# Patient Record
Sex: Male | Born: 1965
Health system: Southern US, Community
[De-identification: ages and names within clinical notes are randomized; demographics above are authoritative.]

## PROBLEM LIST (undated history)

## (undated) DIAGNOSIS — R011 Cardiac murmur, unspecified: Secondary | ICD-10-CM

## (undated) DIAGNOSIS — F32A Depression, unspecified: Secondary | ICD-10-CM

## (undated) DIAGNOSIS — Z8659 Personal history of other mental and behavioral disorders: Secondary | ICD-10-CM

## (undated) DIAGNOSIS — J302 Other seasonal allergic rhinitis: Secondary | ICD-10-CM

## (undated) DIAGNOSIS — Z8042 Family history of malignant neoplasm of prostate: Secondary | ICD-10-CM

## (undated) DIAGNOSIS — K219 Gastro-esophageal reflux disease without esophagitis: Secondary | ICD-10-CM

## (undated) DIAGNOSIS — R7301 Impaired fasting glucose: Secondary | ICD-10-CM

## (undated) DIAGNOSIS — I10 Essential (primary) hypertension: Secondary | ICD-10-CM

## (undated) DIAGNOSIS — F1010 Alcohol abuse, uncomplicated: Secondary | ICD-10-CM

## (undated) DIAGNOSIS — F1011 Alcohol abuse, in remission: Secondary | ICD-10-CM

## (undated) DIAGNOSIS — E785 Hyperlipidemia, unspecified: Secondary | ICD-10-CM

## (undated) DIAGNOSIS — Z86010 Personal history of colonic polyps: Secondary | ICD-10-CM

## (undated) DIAGNOSIS — F329 Major depressive disorder, single episode, unspecified: Secondary | ICD-10-CM

## (undated) HISTORY — DX: Major depressive disorder, single episode, unspecified: F32.9

## (undated) HISTORY — DX: Essential (primary) hypertension: I10

## (undated) HISTORY — DX: Cardiac murmur, unspecified: R01.1

## (undated) HISTORY — DX: Personal history of colonic polyps: Z86.010

## (undated) HISTORY — DX: Alcohol abuse, in remission: F10.11

## (undated) HISTORY — PX: OTHER SURGICAL HISTORY: SHX169

## (undated) HISTORY — DX: Depression, unspecified: F32.A

## (undated) HISTORY — DX: Hyperlipidemia, unspecified: E78.5

## (undated) HISTORY — DX: Family history of malignant neoplasm of prostate: Z80.42

## (undated) HISTORY — DX: Impaired fasting glucose: R73.01

## (undated) HISTORY — DX: Personal history of other mental and behavioral disorders: Z86.59

## (undated) HISTORY — DX: Alcohol abuse, uncomplicated: F10.10

## (undated) HISTORY — DX: Other seasonal allergic rhinitis: J30.2

## (undated) HISTORY — DX: Gastro-esophageal reflux disease without esophagitis: K21.9

---
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
## 1997-03-23 HISTORY — PX: WISDOM TOOTH EXTRACTION: SHX21

## 1998-11-09 ENCOUNTER — Emergency Department (HOSPITAL_COMMUNITY): Admission: EM | Admit: 1998-11-09 | Discharge: 1998-11-09 | Payer: Self-pay | Admitting: *Deleted

## 2003-03-24 HISTORY — PX: EYE MUSCLE SURGERY: SHX370

## 2004-02-18 ENCOUNTER — Ambulatory Visit: Payer: Self-pay | Admitting: Internal Medicine

## 2004-02-27 ENCOUNTER — Ambulatory Visit: Payer: Self-pay | Admitting: Internal Medicine

## 2004-04-10 ENCOUNTER — Ambulatory Visit: Payer: Self-pay | Admitting: Internal Medicine

## 2004-04-18 ENCOUNTER — Ambulatory Visit: Payer: Self-pay | Admitting: Internal Medicine

## 2006-05-11 ENCOUNTER — Emergency Department (HOSPITAL_COMMUNITY): Admission: EM | Admit: 2006-05-11 | Discharge: 2006-05-12 | Payer: Self-pay | Admitting: Emergency Medicine

## 2008-11-24 ENCOUNTER — Emergency Department (HOSPITAL_BASED_OUTPATIENT_CLINIC_OR_DEPARTMENT_OTHER): Admission: EM | Admit: 2008-11-24 | Discharge: 2008-11-24 | Payer: Self-pay | Admitting: Emergency Medicine

## 2008-11-27 ENCOUNTER — Ambulatory Visit: Payer: Self-pay | Admitting: Family Medicine

## 2008-11-27 DIAGNOSIS — I10 Essential (primary) hypertension: Secondary | ICD-10-CM | POA: Insufficient documentation

## 2008-11-27 DIAGNOSIS — E785 Hyperlipidemia, unspecified: Secondary | ICD-10-CM | POA: Insufficient documentation

## 2008-11-27 DIAGNOSIS — K219 Gastro-esophageal reflux disease without esophagitis: Secondary | ICD-10-CM

## 2008-11-27 DIAGNOSIS — F329 Major depressive disorder, single episode, unspecified: Secondary | ICD-10-CM

## 2008-11-27 DIAGNOSIS — R209 Unspecified disturbances of skin sensation: Secondary | ICD-10-CM | POA: Insufficient documentation

## 2010-06-26 ENCOUNTER — Encounter: Payer: Self-pay | Admitting: Family Medicine

## 2010-07-02 ENCOUNTER — Ambulatory Visit (INDEPENDENT_AMBULATORY_CARE_PROVIDER_SITE_OTHER): Payer: BC Managed Care – PPO | Admitting: Family Medicine

## 2010-07-02 ENCOUNTER — Encounter: Payer: Self-pay | Admitting: Family Medicine

## 2010-07-02 DIAGNOSIS — I10 Essential (primary) hypertension: Secondary | ICD-10-CM

## 2010-07-02 DIAGNOSIS — Z125 Encounter for screening for malignant neoplasm of prostate: Secondary | ICD-10-CM

## 2010-07-02 DIAGNOSIS — E785 Hyperlipidemia, unspecified: Secondary | ICD-10-CM

## 2010-07-02 DIAGNOSIS — Z0000 Encounter for general adult medical examination without abnormal findings: Secondary | ICD-10-CM

## 2010-07-02 DIAGNOSIS — F1010 Alcohol abuse, uncomplicated: Secondary | ICD-10-CM

## 2010-07-02 LAB — LIPID PANEL
Cholesterol: 194 mg/dL (ref 0–200)
HDL: 44.2 mg/dL (ref >39.00)
LDL Cholesterol: 122 mg/dL — ABNORMAL HIGH (ref 0–99)
Total CHOL/HDL Ratio: 4
Triglycerides: 139 mg/dL (ref 0.0–149.0)
VLDL: 27.8 mg/dL (ref 0.0–40.0)

## 2010-07-02 LAB — CBC WITH DIFFERENTIAL/PLATELET
Basophils Absolute: 0 10*3/uL (ref 0.0–0.1)
Basophils Relative: 0.5 % (ref 0.0–3.0)
Eosinophils Absolute: 0.1 10*3/uL (ref 0.0–0.7)
Eosinophils Relative: 2.4 % (ref 0.0–5.0)
HCT: 43.5 % (ref 39.0–52.0)
Hemoglobin: 15.3 g/dL (ref 13.0–17.0)
Lymphocytes Relative: 32.4 % (ref 12.0–46.0)
Lymphs Abs: 1.3 10*3/uL (ref 0.7–4.0)
MCHC: 35.2 g/dL (ref 30.0–36.0)
MCV: 95 fl (ref 78.0–100.0)
Monocytes Absolute: 0.3 10*3/uL (ref 0.1–1.0)
Monocytes Relative: 8.4 % (ref 3.0–12.0)
Neutro Abs: 2.3 10*3/uL (ref 1.4–7.7)
Neutrophils Relative %: 56.3 % (ref 43.0–77.0)
Platelets: 166 10*3/uL (ref 150.0–400.0)
RBC: 4.58 Mil/uL (ref 4.22–5.81)
RDW: 12.5 % (ref 11.5–14.6)
WBC: 4.2 10*3/uL — ABNORMAL LOW (ref 4.5–10.5)

## 2010-07-02 LAB — COMPREHENSIVE METABOLIC PANEL
ALT: 27 U/L (ref 0–53)
AST: 14 U/L (ref 0–37)
Albumin: 4.5 g/dL (ref 3.5–5.2)
Alkaline Phosphatase: 54 U/L (ref 39–117)
BUN: 15 mg/dL (ref 6–23)
CO2: 31 mEq/L (ref 19–32)
Calcium: 9.5 mg/dL (ref 8.4–10.5)
Chloride: 103 mEq/L (ref 96–112)
Creatinine, Ser: 1 mg/dL (ref 0.4–1.5)
GFR: 83.39 mL/min (ref >60.00)
Glucose, Bld: 100 mg/dL — ABNORMAL HIGH (ref 70–99)
Potassium: 4.6 mEq/L (ref 3.5–5.1)
Sodium: 141 mEq/L (ref 135–145)
Total Bilirubin: 1.1 mg/dL (ref 0.3–1.2)
Total Protein: 6.6 g/dL (ref 6.0–8.3)

## 2010-07-02 LAB — PSA: PSA: 0.6 ng/mL (ref 0.10–4.00)

## 2010-07-02 LAB — TSH: TSH: 2.41 u[IU]/mL (ref 0.35–5.50)

## 2010-07-02 NOTE — Assessment & Plan Note (Signed)
Reviewed the current plans for the patient's chronic diagnoses. Routine lab monitoring ordered/reviewed as appropriate.  Discussed prudent diet and regular exercise, as well as basic weight management options as appropriate.  Discussed available screening tests and procedures as appropriate for patient's age and gender.

## 2010-07-02 NOTE — Assessment & Plan Note (Signed)
DRE normal today.  Obtained blood for PSA screening today.

## 2010-07-02 NOTE — Assessment & Plan Note (Signed)
I recommended he monitor bp a few times a week at home, call or return if persistently >140/90. I gave him a copy of the DASH diet and reviewed it with him today. Encouraged him to cut WAY back on alcohol intake.  Continue daily exercise. No meds at this time.

## 2010-07-02 NOTE — Assessment & Plan Note (Signed)
Check FLP today. Continue lifestyle modifications.

## 2010-07-02 NOTE — Assessment & Plan Note (Signed)
Encouraged patient to continue cutting back or quit altogether.  He is well aware of the physical and social risks of alcohol abuse. I'm checking a cmet and cbc today.

## 2010-07-02 NOTE — Progress Notes (Signed)
Office Note 07/02/2010  CC:  Chief Complaint  Patient presents with  . Annual Exam    no problems    HPI:  Brett Swanson is a 45 y.o. White male who is established with [facility] but new to me, here to establish care and get annual CPE. Past problems include mild HTN and hyperlipidemia, both of which he chose to manage with lifestyle mod only.  He doesn't routinely check his bp, and it has been a few years since his last CPE and lidid screen.  He walks several times per week.  He is trying to eat a low sodium diet that focuses more on lean meat and increased veggies and fruits, 6 small meals per day. He admits to drinking alcohol too much, about 1 bottle of wine per day in recent past but is trying to slowly cut back on this.  His parents both had ETOH problems.  He admits to having a problem but says it hasn't come to the point of adversely affecting his life.     Past Medical History  Diagnosis Date  . Depression   . GERD (gastroesophageal reflux disease)   . Hyperlipidemia   . Hypertension   . Chicken pox     Past Surgical History  Procedure Date  . Tonsillectomy 1973  . Hernia 1972   . Eye muscle surgery 2005  . Adenoidectomy 1973    Family History  Problem Relation Age of Onset  . Alcohol abuse Mother   . Cancer Mother 85    Breast, throat, lung  . Alcohol abuse Father   . Heart disease Father     enlarged heart  . COPD Father   . Hypertension Father   . Cancer Father 20    prostate    History   Social History  . Marital Status: Married    Spouse Name: Claris Che    Number of Children: 1  . Years of Education: 4   Occupational History  . 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 Smoking status: Never Smoker   . Smokeless tobacco: Not on file  . Alcohol Use: Yes  . Drug Use:   . Sexually Active:    Other Topics Concern  . Not on file   Social History Narrative   Married, one daughter.  Best boy for [employer].  Exercise: fast walking.Nonsmoker.  Alcohol: bottle of wine per day--cutting back.  No drug abuse.    No outpatient prescriptions prior to visit.    No Known Allergies  ROS Review of Systems  Constitutional: Negative for fever, chills, appetite change and fatigue.  HENT: Negative for ear pain, congestion, sore throat, neck stiffness and dental problem.   Eyes: Negative for discharge, redness and visual disturbance.  Respiratory: Negative for cough, chest tightness, shortness of breath and wheezing.   Cardiovascular: Negative for chest pain, palpitations and leg swelling.  Gastrointestinal: Negative for nausea, vomiting, abdominal pain, diarrhea and blood in stool.  Genitourinary: Negative for dysuria, urgency, frequency, hematuria, flank pain and difficulty urinating.  Musculoskeletal: Negative for myalgias, back pain, joint swelling and arthralgias.  Skin: Negative for pallor and rash.  Neurological: Negative for dizziness, speech difficulty, weakness and headaches.  Hematological: Negative for adenopathy. Does not bruise/bleed easily.  Psychiatric/Behavioral: Negative for confusion and sleep disturbance. The patient is not nervous/anxious.     PE;  Blood pressure 134/88, pulse 49, height 6' 0.25" (1.835 m), weight 194 lb (87.998  kg), SpO2 98.00%. Gen: Alert, well appearing.  Patient is oriented to person, place, time, and situation. HEENT: Scalp without lesions or hair loss.  Ears: EACs clear, normal epithelium.  TMs with good light reflex and landmarks bilaterally.  Eyes: no injection, icteris, swelling, or exudate.  EOMI, PERRLA. Nose: no drainage or turbinate edema/swelling.  No injection or focal lesion.  Mouth: lips without lesion/swelling.  Oral mucosa pink and moist.  Dentition intact and without obvious caries or gingival swelling.  Oropharynx without erythema, exudate, or swelling.  Some tonsillar tissue is evident in left anterior tonsillar pillar area on left >  right. Neck: supple, ROM full.  Carotids 2+ bilat, without bruit.  No lymphadenopathy, thyromegaly, or mass. Chest: symmetric expansion, nonlabored respirations.  Clear and equal breath sounds in all lung fields.   CV: RRR, no m/r/g.  Peripheral pulses 2+ and symmetric. ABD: soft, NT, ND, BS normal.  No hepatospenomegaly or mass.  No bruits. EXT: no clubbing, cyanosis, or edema.  SKIN: no jaundice, pallor, or worrisome skin lesions. Neuro: CN 2-12 intact bilaterally, strength 5/5 in proximal and distal upper extremities and lower extremities bilaterally.  No sensory deficits.  No tremor.  No disdiadochokinesis.  No ataxia.  Upper extremity and lower extremity DTRs symmetric.  No pronator drift. Rectal: no hemorrhoids or masses, rectal tone normal, no tenderness or nodularity to the prostate gland.  Stool wipings were heme neg in office today.   Pertinent labs:  12 lead EKG: marked sinus bradycardia, biphasic T waves in V1 and V2.  TWI in aVL.  Voltages, axis, and intervals all normal.  ASSESSMENT AND PLAN:   HYPERTENSION I recommended he monitor bp a few times a week at home, call or return if persistently >140/90. I gave him a copy of the DASH diet and reviewed it with him today. Encouraged him to cut WAY back on alcohol intake.  Continue daily exercise. No meds at this time.  HYPERLIPIDEMIA Check FLP today. Continue lifestyle modifications.  Alcohol abuse Encouraged patient to continue cutting back or quit altogether.  He is well aware of the physical and social risks of alcohol abuse. I'm checking a cmet and cbc today.  Prostate cancer screening DRE normal today.  Obtained blood for PSA screening today.  Health maintenance examination Reviewed the current plans for the patient's chronic diagnoses. Routine lab monitoring ordered/reviewed as appropriate.  Discussed prudent diet and regular exercise, as well as basic weight management options as appropriate.  Discussed available  screening tests and procedures as appropriate for patient's age and gender.      Return in about 3 months (around 10/01/2010).

## 2010-09-29 ENCOUNTER — Encounter: Payer: Self-pay | Admitting: Family Medicine

## 2010-09-29 ENCOUNTER — Ambulatory Visit (INDEPENDENT_AMBULATORY_CARE_PROVIDER_SITE_OTHER): Payer: BC Managed Care – PPO | Admitting: Family Medicine

## 2010-09-29 VITALS — BP 129/88 | HR 64 | Temp 98.1°F | Ht 72.25 in | Wt 196.0 lb

## 2010-09-29 DIAGNOSIS — S8010XA Contusion of unspecified lower leg, initial encounter: Secondary | ICD-10-CM

## 2010-09-29 DIAGNOSIS — R011 Cardiac murmur, unspecified: Secondary | ICD-10-CM

## 2010-09-29 DIAGNOSIS — I10 Essential (primary) hypertension: Secondary | ICD-10-CM

## 2010-09-29 DIAGNOSIS — S8011XA Contusion of right lower leg, initial encounter: Secondary | ICD-10-CM | POA: Insufficient documentation

## 2010-09-29 NOTE — Assessment & Plan Note (Signed)
Asymptomatic.  EKG unremarkable in the recent past. Will order 2-D echocardiogram to further assess this.

## 2010-09-29 NOTE — Progress Notes (Signed)
OFFICE NOTE  09/29/2010  CC:  Chief Complaint  Patient presents with  . Follow-up    3 month follow up     HPI:   Patient is a 45 y.o. Caucasian male who is here for f/u HTN. Doing ok with DASH diet, exercise is not-so routine. 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  Small laceration is healing well, hurts in golf ball sized area round this on anterior tibial surface.  Mild pain, worse with wt bearing. Denies chest pain, SOB, DOE, palpitations, or cough.  No legs swelling.  Pertinent PMH:  HTN-diet managemt. Hyperlipidemia in the past--diet mgmt--last lipids 3 mo ago were at goal. ETOH abuse--doing okay and cutting back on this (went 1 week without alcohol last week and said he had no withdrawal sx's or even cravings.  MEDS;   No outpatient prescriptions prior to visit.    PE: Blood pressure 129/88, pulse 64, temperature 98.1 F (36.7 C), temperature source Oral, height 6' 0.25" (1.835 m), weight 196 lb (88.905 kg), SpO2 95.00%. Gen: Alert, well appearing.  Patient is oriented to person, place, time, and situation. Neck: supple, ROM full.  Carotids 2+ bilat, without bruit.  No lymphadenopathy, thyromegaly, or mass. Chest: symmetric expansion, nonlabored respirations.  Clear and equal breath sounds in all lung fields.   CV: RRR, no 2/6 syst murmur at base, best heard at LUSB.  Peripheral pulses 2+ and symmetric. EXT: no clubbing, cyanosis, or edema.  Right anterior tibial surface with 2-3 cm healing abrasion/laceration---scab present.  No erythema, minimal palpable swelling deep to the abrasion.  No deformity.  No fluctuance.  Mildly TTP.   IMPRESSION AND PLAN:  HYPERTENSION Problem stable.  Continue current diet appropriate for this condition.  We have reviewed our general long term plan for this problem and also reviewed symptoms and signs that should prompt the patient to call or return to the office. I encouraged him to monitor  his bp more frequently at home (at least monthly, if not weekly).   Heart murmur, systolic Asymptomatic.  EKG unremarkable in the recent past. Will order 2-D echocardiogram to further assess this.  Contusion of lower leg, right Improving. Continue elevation, relative rest, ice prn, NSAIDs prn.     FOLLOW UP:  Return in about 6 months (around 04/01/2011) for f/u HTN and heart murmur.

## 2010-09-29 NOTE — Assessment & Plan Note (Signed)
Improving. Continue elevation, relative rest, ice prn, NSAIDs prn.

## 2010-09-29 NOTE — Assessment & Plan Note (Signed)
Problem stable.  Continue current diet appropriate for this condition.  We have reviewed our general long term plan for this problem and also reviewed symptoms and signs that should prompt the patient to call or return to the office. I encouraged him to monitor his bp more frequently at home (at least monthly, if not weekly).

## 2010-10-02 ENCOUNTER — Other Ambulatory Visit (HOSPITAL_COMMUNITY): Payer: Self-pay | Admitting: Family Medicine

## 2010-10-02 DIAGNOSIS — R011 Cardiac murmur, unspecified: Secondary | ICD-10-CM

## 2010-10-03 ENCOUNTER — Ambulatory Visit (HOSPITAL_COMMUNITY): Payer: BC Managed Care – PPO | Attending: Internal Medicine | Admitting: Radiology

## 2010-10-03 DIAGNOSIS — E785 Hyperlipidemia, unspecified: Secondary | ICD-10-CM | POA: Insufficient documentation

## 2010-10-03 DIAGNOSIS — I079 Rheumatic tricuspid valve disease, unspecified: Secondary | ICD-10-CM | POA: Insufficient documentation

## 2010-10-03 DIAGNOSIS — R011 Cardiac murmur, unspecified: Secondary | ICD-10-CM | POA: Insufficient documentation

## 2010-10-03 DIAGNOSIS — I10 Essential (primary) hypertension: Secondary | ICD-10-CM | POA: Insufficient documentation

## 2010-10-03 HISTORY — PX: TRANSTHORACIC ECHOCARDIOGRAM: SHX275

## 2011-04-01 ENCOUNTER — Encounter: Payer: Self-pay | Admitting: Family Medicine

## 2011-04-01 ENCOUNTER — Ambulatory Visit (INDEPENDENT_AMBULATORY_CARE_PROVIDER_SITE_OTHER): Payer: BC Managed Care – PPO | Admitting: Family Medicine

## 2011-04-01 DIAGNOSIS — I10 Essential (primary) hypertension: Secondary | ICD-10-CM

## 2011-04-01 DIAGNOSIS — Z23 Encounter for immunization: Secondary | ICD-10-CM

## 2011-04-01 DIAGNOSIS — E785 Hyperlipidemia, unspecified: Secondary | ICD-10-CM

## 2011-04-01 DIAGNOSIS — F1010 Alcohol abuse, uncomplicated: Secondary | ICD-10-CM

## 2011-04-01 DIAGNOSIS — Z125 Encounter for screening for malignant neoplasm of prostate: Secondary | ICD-10-CM

## 2011-04-01 NOTE — Assessment & Plan Note (Signed)
Problem stable.  Continue current diet appropriate for this condition.  We have reviewed our general long term plan for this problem and also reviewed symptoms and signs that should prompt the patient to call or return to the office. We'll recheck lipids at his CPE in 4-6 mo.

## 2011-04-01 NOTE — Assessment & Plan Note (Signed)
Problem stable.  Continue current diet appropriate for this condition.  We have reviewed our general long term plan for this problem and also reviewed symptoms and signs that should prompt the patient to call or return to the office. Continue periodic home monitoring: call if persistently > 150/95. Encouraged him to keep up the exercise and lower alcohol intake.

## 2011-04-01 NOTE — Assessment & Plan Note (Signed)
Encouraged him to knock it down to max of 1 glass of wine per night. No social/legal problems arising from his wine drinking at this point, although these calories are not helping his bp and wt any.

## 2011-04-01 NOTE — Progress Notes (Signed)
OFFICE NOTE  04/01/2011  CC:  Chief Complaint  Patient presents with  . Follow-up     HPI:   Patient is a 46 y.o. Caucasian male who is here for 6 mo f/u HTN, hyperlipidemia, GERD, and hx of ETOH abuse. Feeling good, power walking x 2 miles 5 days a week.  Following DASH diet more and more. Rare home bp checks "when I feel stressed" are max 130s over 90. No HA, vision c/o, CP, palpitations, or Sob. Has gone back to drinking more, vague answers about how much given today but sounds like over the holiday season he reverted back to his "bottle of wine a day" amount.  Admits that this is not good, plans on cutting back again.  No probs with law or socially with this amount of intake.    Pertinent PMH:  Past Medical History  Diagnosis Date  . Depression   . GERD (gastroesophageal reflux disease)   . Hyperlipidemia   . Hypertension   . Chicken pox   . Alcohol abuse     1 bottle of wine per day; began cutting back 2012  . Heart murmur, systolic     2 D ECHO normal 09/2010   Past surgical, family, and social history reviewed and there are no changes since the patient's last office visit with me.  MEDS;  NONE No outpatient prescriptions prior to visit.    PE: Blood pressure 127/89, pulse 47, height 6' 0.25" (1.835 m), weight 199 lb (90.266 kg). Gen: Alert, well appearing.  Patient is oriented to person, place, time, and situation. ENT: Ears: EACs clear, normal epithelium.  TMs with good light reflex and landmarks bilaterally.  Eyes: no injection, icteris, swelling, or exudate.  EOMI, PERRLA. Nose: no drainage or turbinate edema/swelling.  No injection or focal lesion.  Mouth: lips without lesion/swelling.  Oral mucosa pink and moist.  Dentition intact and without obvious caries or gingival swelling.  Oropharynx without erythema, exudate, or swelling.  Neck - No masses or thyromegaly or limitation in range of motion CV: RRR, no m/r/g.   LUNGS: CTA bilat, nonlabored resps, good  aeration in all lung fields. EXT: no clubbing, cyanosis, or edema.   LABS: none today  IMPRESSION AND PLAN:  HYPERTENSION Problem stable.  Continue current diet appropriate for this condition.  We have reviewed our general long term plan for this problem and also reviewed symptoms and signs that should prompt the patient to call or return to the office. Continue periodic home monitoring: call if persistently > 150/95. Encouraged him to keep up the exercise and lower alcohol intake.  HYPERLIPIDEMIA Problem stable.  Continue current diet appropriate for this condition.  We have reviewed our general long term plan for this problem and also reviewed symptoms and signs that should prompt the patient to call or return to the office. We'll recheck lipids at his CPE in 4-6 mo.   Alcohol abuse Encouraged him to knock it down to max of 1 glass of wine per night. No social/legal problems arising from his wine drinking at this point, although these calories are not helping his bp and wt any.     FOLLOW UP:  No Follow-up on file. F/u 4-100mo for CPE and get fasting labs the week prior (orders entered as future today).

## 2011-08-24 ENCOUNTER — Other Ambulatory Visit (INDEPENDENT_AMBULATORY_CARE_PROVIDER_SITE_OTHER): Payer: BC Managed Care – PPO

## 2011-08-24 DIAGNOSIS — Z23 Encounter for immunization: Secondary | ICD-10-CM

## 2011-08-24 DIAGNOSIS — E785 Hyperlipidemia, unspecified: Secondary | ICD-10-CM

## 2011-08-24 DIAGNOSIS — Z125 Encounter for screening for malignant neoplasm of prostate: Secondary | ICD-10-CM

## 2011-08-24 DIAGNOSIS — F1010 Alcohol abuse, uncomplicated: Secondary | ICD-10-CM

## 2011-08-24 DIAGNOSIS — I10 Essential (primary) hypertension: Secondary | ICD-10-CM

## 2011-08-24 LAB — LIPID PANEL
Cholesterol: 221 mg/dL — ABNORMAL HIGH (ref 0–200)
HDL: 43.6 mg/dL (ref >39.00)
Total CHOL/HDL Ratio: 5
Triglycerides: 365 mg/dL — ABNORMAL HIGH (ref 0.0–149.0)
VLDL: 73 mg/dL — ABNORMAL HIGH (ref 0.0–40.0)

## 2011-08-24 LAB — CBC WITH DIFFERENTIAL/PLATELET
Basophils Absolute: 0 10*3/uL (ref 0.0–0.1)
Basophils Relative: 0.7 % (ref 0.0–3.0)
Eosinophils Absolute: 0.1 10*3/uL (ref 0.0–0.7)
Eosinophils Relative: 3.1 % (ref 0.0–5.0)
HCT: 42.8 % (ref 39.0–52.0)
Hemoglobin: 14.8 g/dL (ref 13.0–17.0)
Lymphocytes Relative: 32.3 % (ref 12.0–46.0)
Lymphs Abs: 1.3 10*3/uL (ref 0.7–4.0)
MCHC: 34.5 g/dL (ref 30.0–36.0)
MCV: 95 fl (ref 78.0–100.0)
Monocytes Absolute: 0.4 10*3/uL (ref 0.1–1.0)
Monocytes Relative: 10.4 % (ref 3.0–12.0)
Neutro Abs: 2.2 10*3/uL (ref 1.4–7.7)
Neutrophils Relative %: 53.5 % (ref 43.0–77.0)
Platelets: 150 10*3/uL (ref 150.0–400.0)
RBC: 4.51 Mil/uL (ref 4.22–5.81)
RDW: 13.2 % (ref 11.5–14.6)
WBC: 4.2 10*3/uL — ABNORMAL LOW (ref 4.5–10.5)

## 2011-08-24 LAB — TSH: TSH: 2.74 u[IU]/mL (ref 0.35–5.50)

## 2011-08-24 LAB — COMPREHENSIVE METABOLIC PANEL
ALT: 40 U/L (ref 0–53)
AST: 23 U/L (ref 0–37)
Albumin: 4.3 g/dL (ref 3.5–5.2)
Alkaline Phosphatase: 59 U/L (ref 39–117)
BUN: 17 mg/dL (ref 6–23)
CO2: 28 mEq/L (ref 19–32)
Calcium: 9.6 mg/dL (ref 8.4–10.5)
Chloride: 104 mEq/L (ref 96–112)
Creatinine, Ser: 0.9 mg/dL (ref 0.4–1.5)
GFR: 99.48 mL/min (ref >60.00)
Glucose, Bld: 109 mg/dL — ABNORMAL HIGH (ref 70–99)
Potassium: 4 mEq/L (ref 3.5–5.1)
Sodium: 140 mEq/L (ref 135–145)
Total Bilirubin: 1.1 mg/dL (ref 0.3–1.2)
Total Protein: 6.5 g/dL (ref 6.0–8.3)

## 2011-08-24 LAB — PSA: PSA: 0.97 ng/mL (ref 0.10–4.00)

## 2011-09-01 ENCOUNTER — Encounter: Payer: Self-pay | Admitting: Family Medicine

## 2011-09-01 ENCOUNTER — Telehealth: Payer: Self-pay

## 2011-09-01 ENCOUNTER — Ambulatory Visit (INDEPENDENT_AMBULATORY_CARE_PROVIDER_SITE_OTHER): Payer: BC Managed Care – PPO | Admitting: Family Medicine

## 2011-09-01 VITALS — BP 133/87 | HR 62 | Ht 72.25 in | Wt 202.0 lb

## 2011-09-01 DIAGNOSIS — L259 Unspecified contact dermatitis, unspecified cause: Secondary | ICD-10-CM

## 2011-09-01 DIAGNOSIS — Z0000 Encounter for general adult medical examination without abnormal findings: Secondary | ICD-10-CM

## 2011-09-01 LAB — POCT URINALYSIS DIPSTICK
Blood, UA: NEGATIVE
Glucose, UA: NEGATIVE
Ketones, UA: NEGATIVE
Spec Grav, UA: 1.025
Urobilinogen, UA: 0.2

## 2011-09-01 MED ORDER — FLUTICASONE PROPIONATE 0.05 % EX CREA: TOPICAL_CREAM | Freq: Two times a day (BID) | CUTANEOUS | Status: AC

## 2011-09-01 NOTE — Assessment & Plan Note (Signed)
Discussed use of nonsedating OTC antihistamine prn. Start trial of generic cutivate 0.05% cream bid prn.

## 2011-09-01 NOTE — Telephone Encounter (Signed)
Pt states that he used the Fluticasone cream around 11 this morning and the rash seems to have got worse and blisters are bigger? Pt wants to know if the medication is causing this? Please advise?

## 2011-09-01 NOTE — Progress Notes (Signed)
Office Note 09/01/2011  CC:  Chief Complaint  Patient presents with  . Annual Exam    no problems    HPI:  Brett Swanson is a 46 y.o. White male who is here for annual health maintenance exam/CPE. He had fasting labs done last week and these were normal except gluc a bit over 100 and trigs elevated/HDL low.  PSA had increased 0.35 ng/ml over the last 63mo, but PSA total still wnl for age (.25). We reviewed and discussed these in detail today. Still drinking 2 drinks per night most nights, trying to cut back. Admits to excessive red meat intake in last 6 mo, admits to not exercising daily (at his max he was just walking on treadmill some days).  Has acute c/o itchy rash on both forearms and under left side of chin since coming in contact with poison ivy 3 d/a.  Has been applying caladryl lotion and it helps a little.    Past Medical History  Diagnosis Date  . Depression   . GERD (gastroesophageal reflux disease)   . Hyperlipidemia   . Hypertension   . Alcohol abuse     1 bottle of wine per day; began cutting back 2012  . Heart murmur, systolic     2 D ECHO normal 09/2010    Past Surgical History  Procedure Date  . Tonsillectomy 1973  . Hernia 1972   . Eye muscle surgery 2005  . Adenoidectomy 1973    Family History  Problem Relation Age of Onset  . Alcohol abuse Mother   . Cancer Mother 29    Breast, throat, lung  . Alcohol abuse Father   . Heart disease Father     enlarged heart  . COPD Father   . Hypertension Father   . Cancer Father 50    prostate    History   Social History  . Marital Status: Married    Spouse Name: Claris Che    Number of Children: 1  . Years of Education: 4   Occupational History  . 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 Smoking status: Never Smoker   . Smokeless tobacco: Never Used  . Alcohol Use: Yes     2 drinks a night  . Drug Use: Not on file  . Sexually Active: Not on file   Other Topics  Concern  . Not on file   Social History Narrative   Married, one daughter.  Best boy for [employer].  Exercise: fast walking.Nonsmoker.  Alcohol: bottle of wine per day--cutting back.  No drug abuse.    MEDS: NONE  No Known Allergies  ROS Review of Systems  Constitutional: Negative for fever, chills, appetite change and fatigue.  HENT: Negative for ear pain, congestion, sore throat, neck stiffness and dental problem.   Eyes: Negative for discharge, redness and visual disturbance.  Respiratory: Negative for cough, chest tightness, shortness of breath and wheezing.   Cardiovascular: Negative for chest pain, palpitations and leg swelling.  Gastrointestinal: Negative for nausea, vomiting, abdominal pain, diarrhea and blood in stool.  Genitourinary: Negative for dysuria, urgency, frequency, hematuria, flank pain and difficulty urinating.  Musculoskeletal: Negative for myalgias, back pain, joint swelling and arthralgias.  Skin: Positive for rash. Negative for pallor.       See HPI  Neurological: Negative for dizziness, speech difficulty, weakness and headaches.  Hematological: Negative for adenopathy. Does not bruise/bleed easily.  Psychiatric/Behavioral: Negative for confusion and sleep  disturbance. The patient is not nervous/anxious.     PE; Blood pressure 133/87, pulse 62, height 6' 0.25" (1.835 m), weight 202 lb (91.627 kg). Gen: Alert, well appearing.  Patient is oriented to person, place, time, and situation. ENT: Ears: EACs clear, normal epithelium.  TMs with good light reflex and landmarks bilaterally.  Eyes: no injection, icteris, swelling, or exudate.  EOMI, PERRLA. Nose: no drainage or turbinate edema/swelling.  No injection or focal lesion.  Mouth: lips without lesion/swelling.  Oral mucosa pink and moist.  Dentition intact and without obvious caries or gingival swelling.  Oropharynx without erythema, exudate, or swelling.  Neck: supple/nontender.  No LAD, mass,  or TM.  Carotid pulses 2+ bilaterally, without bruits. CV: RRR, no m/r/g.   LUNGS: CTA bilat, nonlabored resps, good aeration in all lung fields. ABD: soft, NT, ND, BS normal.  No hepatospenomegaly or mass.  No bruits. EXT: no clubbing, cyanosis, or edema.  Genitals normal; both testes normal without tenderness, masses, hydroceles, varicoceles, erythema or swelling. Shaft normal, circumcised, meatus normal without discharge. No inguinal hernia noted. No inguinal lymphadenopathy. Rectal exam: negative without mass, lesions or tenderness.  Prostate size feels normal, without nodularity or tenderness. SKIN: confluent pinkish papular rash with hyperkeratotic features on both forearms and left side of neck. Joints: no swelling or erythema.  Pertinent labs:   CC UA today was normal.  Lab Results  Component Value Date   TSH 2.74 08/24/2011   Lab Results  Component Value Date   WBC 4.2* 08/24/2011   HGB 14.8 08/24/2011   HCT 42.8 08/24/2011   MCV 95.0 08/24/2011   PLT 150.0 08/24/2011   Lab Results  Component Value Date   CREATININE 0.9 08/24/2011   BUN 17 08/24/2011   NA 140 08/24/2011   K 4.0 08/24/2011   CL 104 08/24/2011   CO2 28 08/24/2011   Lab Results  Component Value Date   ALT 40 08/24/2011   AST 23 08/24/2011   ALKPHOS 59 08/24/2011   BILITOT 1.1 08/24/2011   Lab Results  Component Value Date   CHOL 221* 08/24/2011   Lab Results  Component Value Date   HDL 43.60 08/24/2011   Lab Results  Component Value Date   LDLCALC 122* 07/02/2010   Lab Results  Component Value Date   TRIG 365.0* 08/24/2011   Lab Results  Component Value Date   CHOLHDL 5 08/24/2011   Lab Results  Component Value Date   PSA 0.97 08/24/2011   PSA 0.60 07/02/2010       ASSESSMENT AND PLAN:   Health maintenance examination Reviewed age and gender appropriate health maintenance issues (prudent diet, regular exercise, health risks of tobacco and excessive alcohol, use of seatbelts, fire alarms in home, use of sunscreen).   Also reviewed age and gender appropriate health screening as well as vaccine recommendations. I discussed his recent PSA testing showing borderline accelerated value compared to 66mo ago.  DRE normal.  Plan to repeat PSA in 6 mo. Emphasized importance of dietary/exercise/lifestyle modification in decreasing his risk of future morbidity. Plan is to repeat FLP and CMET at f/u in 6 months.  Contact dermatitis Discussed use of nonsedating OTC antihistamine prn. Start trial of generic cutivate 0.05% cream bid prn.     FOLLOW UP:  Return in about 6 months (around 03/02/2012) for office f/u and fasting labs.

## 2011-09-01 NOTE — Telephone Encounter (Signed)
Pt informed

## 2011-09-01 NOTE — Assessment & Plan Note (Signed)
Reviewed age and gender appropriate health maintenance issues (prudent diet, regular exercise, health risks of tobacco and excessive alcohol, use of seatbelts, fire alarms in home, use of sunscreen).  Also reviewed age and gender appropriate health screening as well as vaccine recommendations. I discussed his recent PSA testing showing borderline accelerated value compared to 42mo ago.  DRE normal.  Plan to repeat PSA in 6 mo. Emphasized importance of dietary/exercise/lifestyle modification in decreasing his risk of future morbidity. Plan is to repeat FLP and CMET at f/u in 6 months.

## 2011-09-01 NOTE — Telephone Encounter (Signed)
Do not know for sure if the cream is making the rash worse because I did not see it but it is possible. So he can not use any further doses today and see how it is in the morning. May take some Zyrtec today and some Benadryl tonight to manage the itching and burning

## 2012-03-02 ENCOUNTER — Ambulatory Visit: Payer: BC Managed Care – PPO | Admitting: Family Medicine

## 2015-04-16 ENCOUNTER — Encounter: Payer: Self-pay | Admitting: Family Medicine

## 2015-04-16 ENCOUNTER — Ambulatory Visit (INDEPENDENT_AMBULATORY_CARE_PROVIDER_SITE_OTHER): Payer: BLUE CROSS/BLUE SHIELD | Admitting: Family Medicine

## 2015-04-16 VITALS — BP 130/83 | HR 67 | Temp 99.9°F | Resp 20 | Wt 197.0 lb

## 2015-04-16 DIAGNOSIS — J0100 Acute maxillary sinusitis, unspecified: Secondary | ICD-10-CM | POA: Insufficient documentation

## 2015-04-16 MED ORDER — DOXYCYCLINE HYCLATE 100 MG PO TABS: 100.0000 mg | ORAL_TABLET | Freq: Two times a day (BID) | ORAL | Status: DC

## 2015-04-16 NOTE — Patient Instructions (Signed)

## 2015-04-16 NOTE — Progress Notes (Signed)
Patient ID: Brett Swanson, male   DOB: 10-Oct-1965, 51 y.o.   MRN: [id number]   Subjective:    Patient ID: Brett Swanson  DOB: Nov 28, 1965, 50 y.o.    MRN: [id number]  HPI  Sinus pressure: Patient presents for an acute office visit with a five-day history of nausea, rhinorrhea, sinus pressure, fever Tmax 101, chills, sneezing, dry cough and headache. Patient denies sore throat, vomit, diarrhea or rash. He has taken Alka-Seltzer cold medicine and aspirin for his symptoms, without relief. He has not had his flu shot this year, and has declined. He has a history of pneumonia greater than 10 years ago. He does not have any history of asthma.   All past medical history, allergies, surgical history, social history, family history was updated today since patient has not been seen in > 3  Years.   Past Medical History  Diagnosis Date  . Depression   . GERD (gastroesophageal reflux disease)   . Hyperlipidemia   . Hypertension   . Alcohol abuse     1 bottle of wine per day; began cutting back 2012  . Heart murmur, systolic     2 D ECHO normal 09/2010   No Known Allergies   Past Surgical History  Procedure Laterality Date  . Tonsillectomy  1973  . Hernia 1972    . Eye muscle surgery  2005  . Adenoidectomy  1973   Social History  Substance Use Topics  . Smoking status: Never Smoker   . Smokeless tobacco: Never Used  . Alcohol Use: Yes     Comment: 2 drinks a night    Review of Systems Negative, with the exception of above mentioned in HPI     Objective:   Physical Exam BP 130/83 mmHg  Pulse 67  Temp(Src) 99.9 F (37.7 C) (Oral)  Resp 20  Wt 197 lb (89.359 kg)  SpO2 96% Body mass index is 26.54 kg/(m^2). Gen: febrile. No acute distress. Nontoxic in appearance. Well developed, well nourished, pleasant, Caucasian male. HENT: AT. [place]. Bilateral TM visualized, full/no erythema or bulging. MMM, no oral lesions. Bilateral nares with erythema and swelling. Throat without  erythema, no exudates. Cough mild, Hoarseness no, TTP bilateral maxillary sinus. Eyes:Pupils Equal Round Reactive to light, Extraocular movements intact,  Conjunctiva without redness, discharge or icterus. Neck/lymp/: Supple, anterior cervical lymphadenopathy. Normal range of motion CV: RRR no murmurs, clicks, gallops, rubs. No edema. Chest: CTAB, no wheeze or crackles. Normal respiratory effort. Good air movement. Abd: Soft. Flat. NTND. BS present Skin: No rashes, purpura or petechiae. Neuro: Alert, Perla, EOMI, oriented 3.    Assessment & Plan:  Brett Swanson is a 50 y.o. present for acute OV after  > 3 years since OV with PCP.  1. Acute maxillary sinusitis, recurrence not specified - Encourage Flonase, Mucinex, rest hydration, humidifier use. - doxycycline (VIBRA-TABS) 100 MG tablet; Take 1 tablet (100 mg total) by mouth 2 (two) times daily.  Dispense: 20 tablet; Refill: 0 - Follow-up as needed  > 25 minutes spent with patient, >50% of time spent face to face counseling patient and coordinating care.

## 2015-06-21 ENCOUNTER — Telehealth: Payer: Self-pay

## 2015-06-21 NOTE — Telephone Encounter (Signed)
Patient declined flu vaccine this year.

## 2016-10-23 ENCOUNTER — Ambulatory Visit (INDEPENDENT_AMBULATORY_CARE_PROVIDER_SITE_OTHER): Payer: BLUE CROSS/BLUE SHIELD | Admitting: Family Medicine

## 2016-10-23 ENCOUNTER — Ambulatory Visit: Payer: BLUE CROSS/BLUE SHIELD | Admitting: Family Medicine

## 2016-10-23 ENCOUNTER — Encounter: Payer: Self-pay | Admitting: Family Medicine

## 2016-10-23 VITALS — BP 149/95 | HR 64 | Temp 98.4°F | Resp 16 | Wt 210.0 lb

## 2016-10-23 DIAGNOSIS — S3022XA Contusion of scrotum and testes, initial encounter: Secondary | ICD-10-CM

## 2016-10-23 DIAGNOSIS — M5441 Lumbago with sciatica, right side: Secondary | ICD-10-CM

## 2016-10-23 DIAGNOSIS — N50811 Right testicular pain: Secondary | ICD-10-CM

## 2016-10-23 NOTE — Progress Notes (Signed)
OFFICE VISIT  10/23/2016   CC:  Chief Complaint  Patient presents with  . Testicle Pain    x 1 week   HPI:    Patient is a 51 y.o. Caucasian male who presents for scrotal discomfort. Three weeks ago he woke up with R LB pain, radiated into gluteal area, and lateral aspect of R thigh.  Symptoms waxed and waned over the next week.  He then slept with a body pillow and was feeling less pain in mornings and it got worse later in the day.  Seems to continue gradually getting better.  Then, a few days ago he pinched his R testicle with his leg when he lifted the leg far medially to lift leg over a fence. Notes no swelling or redness of his testicle.  No numbness or tingling anywhere.  Says the pain intensity was mild. Still has a mild ache in R testicle.    Past Medical History:  Diagnosis Date  . Alcohol abuse    1 bottle of wine per day; began cutting back 2012  . Depression   . GERD (gastroesophageal reflux disease)   . Heart murmur, systolic    2 D ECHO normal 09/2010  . Hyperlipidemia   . Hypertension     Past Surgical History:  Procedure Laterality Date  . ADENOIDECTOMY  1973  . EYE MUSCLE SURGERY  2005  . hernia 1972    . TONSILLECTOMY  1973  . TRANSTHORACIC ECHOCARDIOGRAM  10/03/2010   Normal except grd I DD.    Outpatient Medications Prior to Visit  Medication Sig Dispense Refill  . doxycycline (VIBRA-TABS) 100 MG tablet Take 1 tablet (100 mg total) by mouth 2 (two) times daily. (Patient not taking: Reported on 10/23/2016) 20 tablet 0   No facility-administered medications prior to visit.     No Known Allergies  ROS As per HPI  PE: Blood pressure (!) 149/95, pulse 64, temperature 98.4 F (36.9 C), temperature source Oral, resp. rate 16, weight 210 lb (95.3 kg), SpO2 96 %. Gen: Alert, well appearing.  Patient is oriented to person, place, time, and situation. AFFECT: pleasant, lucid thought and speech. Minimal TTP in R LB.  No gluteal tenderness or tenderness  over ischial tuberosity.  LE strength 5/5 prox and dist bilat.  Neg sitting SLR bilat.   GU exam: Genitals normal; both testes without masses, hydroceles, varicoceles, erythema or swelling.  Mild focal tenderness to palpation on R testicle just anterior and lateral to the epididymus.   Penile shaft normal, circumcised, meatus normal without discharge. No inguinal hernia noted. No inguinal lymphadenopathy.   LABS:  none  IMPRESSION AND PLAN:  1) Right testicle contusion; no sign of mass, infection, hernia, or hydrocele. Reassured pt: watchful waiting approach. Suspect his testicle pain is not related in any way to his R LB pain with radiculopathy.  2) R LBP with right radiculopathy sx's: improving gradually. Stretch, rest, tylenol 1000 mg q6h prn.  An After Visit Summary was printed and given to the patient.  FOLLOW UP: Return if symptoms worsen or fail to improve.  Signed:  Crissie Sickles, MD           10/23/2016

## 2016-10-23 NOTE — Progress Notes (Deleted)
OFFICE VISIT  10/23/2016   CC: No chief complaint on file.    HPI:    Patient is a 51 y.o.  male who presents for scrotal discomfort.  Past Medical History:  Diagnosis Date  . Alcohol abuse    1 bottle of wine per day; began cutting back 2012  . Depression   . GERD (gastroesophageal reflux disease)   . Heart murmur, systolic    2 D ECHO normal 09/2010  . Hyperlipidemia   . Hypertension     Past Surgical History:  Procedure Laterality Date  . ADENOIDECTOMY  1973  . EYE MUSCLE SURGERY  2005  . hernia 1972    . TONSILLECTOMY  1973    Outpatient Medications Prior to Visit  Medication Sig Dispense Refill  . doxycycline (VIBRA-TABS) 100 MG tablet Take 1 tablet (100 mg total) by mouth 2 (two) times daily. 20 tablet 0   No facility-administered medications prior to visit.     No Known Allergies  ROS As per HPI  PE: There were no vitals taken for this visit. ***  LABS:    Chemistry      Component Value Date/Time   NA 140 08/24/2011 0824   K 4.0 08/24/2011 0824   CL 104 08/24/2011 0824   CO2 28 08/24/2011 0824   BUN 17 08/24/2011 0824   CREATININE 0.9 08/24/2011 0824      Component Value Date/Time   CALCIUM 9.6 08/24/2011 0824   ALKPHOS 59 08/24/2011 0824   AST 23 08/24/2011 0824   ALT 40 08/24/2011 0824   BILITOT 1.1 08/24/2011 0824     Lab Results  Component Value Date   WBC 4.2 (L) 08/24/2011   HGB 14.8 08/24/2011   HCT 42.8 08/24/2011   MCV 95.0 08/24/2011   PLT 150.0 08/24/2011     IMPRESSION AND PLAN:  No problem-specific Assessment & Plan notes found for this encounter.   FOLLOW UP: No Follow-up on file.

## 2016-11-07 ENCOUNTER — Emergency Department (HOSPITAL_BASED_OUTPATIENT_CLINIC_OR_DEPARTMENT_OTHER): Payer: BLUE CROSS/BLUE SHIELD

## 2016-11-07 ENCOUNTER — Emergency Department (HOSPITAL_BASED_OUTPATIENT_CLINIC_OR_DEPARTMENT_OTHER)
Admission: EM | Admit: 2016-11-07 | Discharge: 2016-11-07 | Disposition: A | Discharge status: Home / Self Care | Payer: BLUE CROSS/BLUE SHIELD | Attending: Emergency Medicine | Admitting: Emergency Medicine

## 2016-11-07 ENCOUNTER — Encounter (HOSPITAL_BASED_OUTPATIENT_CLINIC_OR_DEPARTMENT_OTHER): Payer: Self-pay | Admitting: Emergency Medicine

## 2016-11-07 DIAGNOSIS — I10 Essential (primary) hypertension: Secondary | ICD-10-CM | POA: Diagnosis not present

## 2016-11-07 DIAGNOSIS — M79672 Pain in left foot: Secondary | ICD-10-CM | POA: Insufficient documentation

## 2016-11-07 MED ORDER — TRAMADOL HCL 50 MG PO TABS: 50.0000 mg | ORAL_TABLET | Freq: Four times a day (QID) | ORAL | 0 refills | Status: DC | PRN

## 2016-11-07 NOTE — ED Notes (Signed)
Alert, NAD, calm, interactive, resps e/u, speaking in clear complete sentences, no dyspnea noted, skin W&D, VSS, "feel about the same", (denies: sob, nausea, dizziness or visual changes). Family at [facility].

## 2016-11-07 NOTE — ED Provider Notes (Signed)
Emergency Department Provider Note   I have reviewed the triage vital signs and the nursing notes.   HISTORY  Chief Complaint Foot Pain   HPI Brett Swanson is a 51 y.o. male presents to the ED with left foot pain for the last 24 hours after trying to jump over a ditch yesterday. He was wearing sandals at the time. Having pain at the base of the left great toe. No numbness or tingling. No reported laceration or abrasions. No falls with head trauma or LOC. Patient ambulatory but walking on the outside of the left foot. No fever or chills. No knee pain. No radiation of symptoms. Pain worse with ambulation or movement.   Past Medical History:  Diagnosis Date  . Alcohol abuse    1 bottle of wine per day; began cutting back 2012  . Depression   . GERD (gastroesophageal reflux disease)   . Heart murmur, systolic    2 D ECHO normal 09/2010  . Hyperlipidemia   . Hypertension     Patient Active Problem List   Diagnosis Date Noted  . Maxillary sinusitis, acute 04/16/2015  . Health maintenance examination 09/01/2011  . Contact dermatitis 09/01/2011  . Heart murmur, systolic 35/46/5681  . Alcohol abuse 07/02/2010  . HYPERLIPIDEMIA 11/27/2008  . DEPRESSION 11/27/2008  . HYPERTENSION 11/27/2008  . GERD 11/27/2008    Past Surgical History:  Procedure Laterality Date  . ADENOIDECTOMY  1973  . EYE MUSCLE SURGERY  2005  . hernia 1972    . TONSILLECTOMY  1973  . TRANSTHORACIC ECHOCARDIOGRAM  10/03/2010   Normal except grd I DD.    Current Outpatient Rx  . Order #: [id number] Class: Normal  . Order #: [id number] Class: Print    Allergies Patient has no known allergies.  Family History  Problem Relation Age of Onset  . Alcohol abuse Mother   . Cancer Mother 18       Breast, throat, lung  . Alcohol abuse Father   . Heart disease Father        enlarged heart  . COPD Father   . Hypertension Father   . Cancer Father 46       prostate    Social History Social History    Substance Use Topics  . Smoking status: Never Smoker  . Smokeless tobacco: Never Used  . Alcohol use Yes     Comment: 2 drinks a night    Review of Systems  Constitutional: No fever/chills Eyes: No visual changes. ENT: No sore throat. Cardiovascular: Denies chest pain. Respiratory: Denies shortness of breath. Gastrointestinal: No abdominal pain.  No nausea, no vomiting.  No diarrhea.  No constipation. Genitourinary: Negative for dysuria. Musculoskeletal: Negative for back pain. Positive left foot pain.  Skin: Negative for rash. Neurological: Negative for headaches, focal weakness or numbness.  10-point ROS otherwise negative.  ____________________________________________   PHYSICAL EXAM:  VITAL SIGNS: ED Triage Vitals [11/07/16 2030]  Enc Vitals Group     BP (!) 157/107     Pulse Rate 70     Resp 18     Temp 98.7 F (37.1 C)     Temp Source Oral     SpO2 96 %     Weight 210 lb (95.3 kg)     Height 6' (1.829 m)     Pain Score 6   Constitutional: Alert and oriented. Well appearing and in no acute distress. Eyes: Conjunctivae are normal.  Head: Atraumatic. Nose: No congestion/rhinnorhea. Mouth/Throat: Mucous membranes are  moist.  Neck: No stridor.  Cardiovascular: Normal rate, regular rhythm. Good peripheral circulation. Grossly normal heart sounds.   Respiratory: Normal respiratory effort.  No retractions. Lungs CTAB. Musculoskeletal: No lower extremity edema. No gross deformities of extremities. Mild tenderness over the left MTP. No redness or warmth.  Neurologic:  Normal speech and language. No gross focal neurologic deficits are appreciated.  Skin:  Skin is warm, dry and intact. No rash noted.  ____________________________________________  RADIOLOGY  Dg Foot Complete Left  Result Date: 11/07/2016 CLINICAL DATA:  Pain after stepping on rock EXAM: LEFT FOOT - COMPLETE 3+ VIEW COMPARISON:  None. FINDINGS: Frontal, oblique, and lateral views were obtained.  There is no fracture or dislocation. There is slight narrowing at the first MTP joint. No erosive change. IMPRESSION: Slight osteoarthritic change in the first MTP joint. No fracture or dislocation. Electronically Signed   By: Lowella Grip III M.D.   On: 11/07/2016 20:49    ____________________________________________   PROCEDURES  Procedure(s) performed:   Procedures  None ____________________________________________   INITIAL IMPRESSION / ASSESSMENT AND PLAN / ED COURSE  Pertinent labs & imaging results that were available during my care of the patient were reviewed by me and considered in my medical decision making (see chart for details).  Patient with mild left toe pain after injury yesterday. No midfoot discomfort. Ambulatory with some discomfort. No fracture or dislocation on plain film. Offered crutches but patient has some at home. Referred to Podiatry and advised NSAIDs for pain. Provided Tramadol for severe pain.   At this time, I do not feel there is any life-threatening condition present. I have reviewed and discussed all results (EKG, imaging, lab, urine as appropriate), exam findings with patient. I have reviewed nursing notes and appropriate previous records.  I feel the patient is safe to be discharged home without further emergent workup. Discussed usual and customary return precautions. Patient and family (if present) verbalize understanding and are comfortable with this plan.  Patient will follow-up with their primary care provider. If they do not have a primary care provider, information for follow-up has been provided to them. All questions have been answered.  ____________________________________________  FINAL CLINICAL IMPRESSION(S) / ED DIAGNOSES  Final diagnoses:  Foot pain, left     MEDICATIONS GIVEN DURING THIS VISIT:  Medications - No data to display   NEW OUTPATIENT MEDICATIONS STARTED DURING THIS VISIT:  Discharge Medication List as of  11/07/2016  9:56 PM    START taking these medications   Details  traMADol (ULTRAM) 50 MG tablet Take 1 tablet (50 mg total) by mouth every 6 (six) hours as needed., Starting Sat 11/07/2016, Print        Note:  This document was prepared using Dragon voice recognition software and may include unintentional dictation errors.  Nanda Quinton, MD Emergency Medicine    Long, [facility], MD 11/08/16 [phone]

## 2016-11-07 NOTE — Discharge Instructions (Signed)

## 2016-11-07 NOTE — ED Triage Notes (Signed)
Patient states that he was jumping over a ditch yesterday and landed wrong on his right foot. Patient continues to have pain to his right foot

## 2018-04-14 ENCOUNTER — Encounter: Payer: Self-pay | Admitting: Family Medicine

## 2018-04-14 ENCOUNTER — Encounter: Payer: Self-pay | Admitting: Gastroenterology

## 2018-04-14 ENCOUNTER — Ambulatory Visit (INDEPENDENT_AMBULATORY_CARE_PROVIDER_SITE_OTHER): Payer: BLUE CROSS/BLUE SHIELD | Admitting: Family Medicine

## 2018-04-14 VITALS — BP 132/82 | HR 54 | Temp 97.9°F | Resp 16 | Ht 72.5 in | Wt 202.0 lb

## 2018-04-14 DIAGNOSIS — Z125 Encounter for screening for malignant neoplasm of prostate: Secondary | ICD-10-CM

## 2018-04-14 DIAGNOSIS — E7800 Pure hypercholesterolemia, unspecified: Secondary | ICD-10-CM | POA: Diagnosis not present

## 2018-04-14 DIAGNOSIS — Z23 Encounter for immunization: Secondary | ICD-10-CM

## 2018-04-14 DIAGNOSIS — I10 Essential (primary) hypertension: Secondary | ICD-10-CM

## 2018-04-14 DIAGNOSIS — Z0000 Encounter for general adult medical examination without abnormal findings: Secondary | ICD-10-CM

## 2018-04-14 DIAGNOSIS — Z1211 Encounter for screening for malignant neoplasm of colon: Secondary | ICD-10-CM

## 2018-04-14 DIAGNOSIS — E663 Overweight: Secondary | ICD-10-CM

## 2018-04-14 LAB — CBC WITH DIFFERENTIAL/PLATELET
Basophils Absolute: 0 10*3/uL (ref 0.0–0.1)
Basophils Relative: 0.8 % (ref 0.0–3.0)
EOS ABS: 0.1 10*3/uL (ref 0.0–0.7)
EOS PCT: 1.6 % (ref 0.0–5.0)
HCT: 46.7 % (ref 39.0–52.0)
HEMOGLOBIN: 16 g/dL (ref 13.0–17.0)
LYMPHS ABS: 1 10*3/uL (ref 0.7–4.0)
Lymphocytes Relative: 27.6 % (ref 12.0–46.0)
MCHC: 34.3 g/dL (ref 30.0–36.0)
MCV: 93.3 fl (ref 78.0–100.0)
MONO ABS: 0.3 10*3/uL (ref 0.1–1.0)
Monocytes Relative: 7.9 % (ref 3.0–12.0)
NEUTROS ABS: 2.3 10*3/uL (ref 1.4–7.7)
NEUTROS PCT: 62.1 % (ref 43.0–77.0)
Platelets: 192 10*3/uL (ref 150.0–400.0)
RBC: 5 Mil/uL (ref 4.22–5.81)
RDW: 13.2 % (ref 11.5–15.5)
WBC: 3.8 10*3/uL — ABNORMAL LOW (ref 4.0–10.5)

## 2018-04-14 LAB — COMPREHENSIVE METABOLIC PANEL
ALBUMIN: 4.8 g/dL (ref 3.5–5.2)
ALT: 51 U/L (ref 0–53)
AST: 17 U/L (ref 0–37)
Alkaline Phosphatase: 49 U/L (ref 39–117)
BUN: 16 mg/dL (ref 6–23)
CO2: 31 mEq/L (ref 19–32)
Calcium: 9.9 mg/dL (ref 8.4–10.5)
Chloride: 102 mEq/L (ref 96–112)
Creatinine, Ser: 0.96 mg/dL (ref 0.40–1.50)
GFR: 82.33 mL/min (ref >60.00)
Glucose, Bld: 107 mg/dL — ABNORMAL HIGH (ref 70–99)
Potassium: 4.4 mEq/L (ref 3.5–5.1)
SODIUM: 139 meq/L (ref 135–145)
Total Bilirubin: 1.1 mg/dL (ref 0.2–1.2)
Total Protein: 6.9 g/dL (ref 6.0–8.3)

## 2018-04-14 LAB — LIPID PANEL
CHOLESTEROL: 201 mg/dL — AB (ref 0–200)
HDL: 41 mg/dL (ref >39.00)
LDL Cholesterol: 133 mg/dL — ABNORMAL HIGH (ref 0–99)
NONHDL: 159.78
Total CHOL/HDL Ratio: 5
Triglycerides: 135 mg/dL (ref 0.0–149.0)
VLDL: 27 mg/dL (ref 0.0–40.0)

## 2018-04-14 LAB — PSA: PSA: 1.07 ng/mL (ref 0.10–4.00)

## 2018-04-14 LAB — TSH: TSH: 1.18 u[IU]/mL (ref 0.35–4.50)

## 2018-04-14 NOTE — Addendum Note (Signed)
Addended by: Caroll Rancher L on: 04/14/2018 10:20 AM   Modules accepted: Orders, SmartSet

## 2018-04-14 NOTE — Progress Notes (Signed)
Office Note 04/14/2018  CC:  Chief Complaint  Patient presents with  . Annual Exam    Pt is fasting.     HPI:  Brett Swanson is a 53 y.o. White male who is here for annual health maintenance exam.  Occ bp check in the recent past outside of the office around 130s/80s.  Diet: trying to make a few sensible changes. Exercise: lots of walking, 8000 steps/day avg.  He has completely quit drinking as of 7 mo ago !!    Past Medical History:  Diagnosis Date  . Alcohol abuse    1 bottle of wine per day; began cutting back 2012  . Depression   . Family history of prostate cancer in father   . GERD (gastroesophageal reflux disease)   . Heart murmur, systolic    2 D ECHO normal 09/2010  . Hyperlipidemia   . Hypertension     Past Surgical History:  Procedure Laterality Date  . ADENOIDECTOMY  1973  . EYE MUSCLE SURGERY  2005  . hernia 1972    . TONSILLECTOMY  1973  . TRANSTHORACIC ECHOCARDIOGRAM  10/03/2010   Normal except grd I DD.    Family History  Problem Relation Age of Onset  . Alcohol abuse Mother   . Cancer Mother 14       Breast, throat, lung  . Alcohol abuse Father   . Heart disease Father        enlarged heart  . COPD Father   . Hypertension Father   . Cancer Father 69       prostate    Social History   Socioeconomic History  . Marital status: Married    Spouse name: Joycelyn Schmid  . Number of children: 1  . Years of education: 4  . Highest education level: Not on file  Occupational History  . Occupation: Scientist, clinical (histocompatibility and immunogenetics): [employer]    Comment: Sr. Psychologist, sport and exercise  Social Needs  . Financial resource strain: Not on file  . Food insecurity:    Worry: Not on file    Inability: Not on file  . Transportation needs:    Medical: Not on file    Non-medical: Not on file  Tobacco Use  . Smoking status: Light Tobacco Smoker    Years: 4.00    Types: Cigarettes  . Smokeless tobacco: Never Used  . Tobacco comment: <1 pk per week  Substance and Sexual  Activity  . Alcohol use: Not Currently    Comment: Quit 10/16/17  . Drug use: No  . Sexual activity: Yes  Lifestyle  . Physical activity:    Days per week: Not on file    Minutes per session: Not on file  . Stress: Not on file  Relationships  . Social connections:    Talks on phone: Not on file    Gets together: Not on file    Attends religious service: Not on file    Active member of club or organization: Not on file    Attends meetings of clubs or organizations: Not on file    Relationship status: Not on file  . Intimate partner violence:    Fear of current or ex partner: Not on file    Emotionally abused: Not on file    Physically abused: Not on file    Forced sexual activity: Not on file  Other Topics Concern  . Not on file  Social History Narrative   Married, one daughter.  Psychologist, sport and exercise for  Surveyor, mining.  Exercise: fast walking.   Nonsmoker.  Alcohol: bottle of wine per day--cutting back.  No drug abuse.    MEDS: none currently  No Known Allergies  ROS Review of Systems  Constitutional: Negative for appetite change, chills, fatigue and fever.  HENT: Negative for congestion, dental problem, ear pain and sore throat.   Eyes: Negative for discharge, redness and visual disturbance.  Respiratory: Negative for cough, chest tightness, shortness of breath and wheezing.   Cardiovascular: Negative for chest pain, palpitations and leg swelling.  Gastrointestinal: Negative for abdominal pain, blood in stool, diarrhea, nausea and vomiting.  Genitourinary: Negative for difficulty urinating, dysuria, flank pain, frequency, hematuria and urgency.  Musculoskeletal: Negative for arthralgias, back pain, joint swelling, myalgias and neck stiffness.  Skin: Negative for pallor and rash.  Neurological: Negative for dizziness, speech difficulty, weakness and headaches.  Hematological: Negative for adenopathy. Does not bruise/bleed easily.  Psychiatric/Behavioral: Negative for  confusion and sleep disturbance. The patient is not nervous/anxious.     PE; Blood pressure 132/82, pulse (!) 54, temperature 97.9 F (36.6 C), temperature source Oral, resp. rate 16, height 6' 0.5" (1.842 m), weight 202 lb (91.6 kg), SpO2 100 %. Body mass index is 27.02 kg/m.  Gen: Alert, well appearing.  Patient is oriented to person, place, time, and situation. AFFECT: pleasant, lucid thought and speech. ENT: Ears: EACs clear, normal epithelium.  TMs with good light reflex and landmarks bilaterally.  Eyes: no injection, icteris, swelling, or exudate.  EOMI, PERRLA. Nose: no drainage or turbinate edema/swelling.  No injection or focal lesion.  Mouth: lips without lesion/swelling.  Oral mucosa pink and moist.  Dentition intact and without obvious caries or gingival swelling.  Oropharynx without erythema, exudate, or swelling.  Neck: supple/nontender.  No LAD, mass, or TM.  Carotid pulses 2+ bilaterally, without bruits. CV: RRR, no m/r/g.   LUNGS: CTA bilat, nonlabored resps, good aeration in all lung fields. ABD: soft, NT, ND, BS normal.  No hepatospenomegaly or mass.  No bruits. EXT: no clubbing, cyanosis, or edema.  Musculoskeletal: no joint swelling, erythema, warmth, or tenderness.  ROM of all joints intact. Skin - no sores or suspicious lesions or rashes or color changes Rectal exam: negative without mass, lesions or tenderness, PROSTATE EXAM: smooth and symmetric without nodules or tenderness.   Pertinent labs:  Lab Results  Component Value Date   TSH 2.74 08/24/2011   Lab Results  Component Value Date   WBC 4.2 (L) 08/24/2011   HGB 14.8 08/24/2011   HCT 42.8 08/24/2011   MCV 95.0 08/24/2011   PLT 150.0 08/24/2011   Lab Results  Component Value Date   CREATININE 0.9 08/24/2011   BUN 17 08/24/2011   NA 140 08/24/2011   K 4.0 08/24/2011   CL 104 08/24/2011   CO2 28 08/24/2011   Lab Results  Component Value Date   ALT 40 08/24/2011   AST 23 08/24/2011   ALKPHOS 59  08/24/2011   BILITOT 1.1 08/24/2011   Lab Results  Component Value Date   CHOL 221 (H) 08/24/2011   Lab Results  Component Value Date   HDL 43.60 08/24/2011   Lab Results  Component Value Date   LDLCALC 122 (H) 07/02/2010   Lab Results  Component Value Date   TRIG 365.0 (H) 08/24/2011   Lab Results  Component Value Date   CHOLHDL 5 08/24/2011   Lab Results  Component Value Date   PSA 0.97 08/24/2011   PSA 0.60 07/02/2010  ASSESSMENT AND PLAN:   Health maintenance exam: Reviewed age and gender appropriate health maintenance issues (prudent diet, regular exercise, health risks of tobacco and excessive alcohol, use of seatbelts, fire alarms in home, use of sunscreen).  Also reviewed age and gender appropriate health screening as well as vaccine recommendations. Vaccines: Tdap due-->given today.   Flu vaccine-->given today. Labs: fasting HP + PSA. Prostate ca screening: DRE normal today , PSA. Colon ca screening: due for initial colon cancer screening-->discussed options today and pt chose to be referred for colonoscopy today.  An After Visit Summary was printed and given to the patient.  FOLLOW UP:  Return in about 1 year (around 04/15/2019) for annual CPE (fasting).  Signed:  Crissie Sickles, MD

## 2018-04-14 NOTE — Patient Instructions (Signed)

## 2018-04-15 ENCOUNTER — Other Ambulatory Visit (INDEPENDENT_AMBULATORY_CARE_PROVIDER_SITE_OTHER): Payer: BLUE CROSS/BLUE SHIELD

## 2018-04-15 DIAGNOSIS — R7301 Impaired fasting glucose: Secondary | ICD-10-CM

## 2018-04-15 LAB — HEMOGLOBIN A1C: Hgb A1c MFr Bld: 5.4 % (ref 4.6–6.5)

## 2018-04-20 ENCOUNTER — Ambulatory Visit (AMBULATORY_SURGERY_CENTER): Payer: Self-pay

## 2018-04-20 ENCOUNTER — Other Ambulatory Visit: Payer: Self-pay

## 2018-04-20 VITALS — Ht 72.0 in | Wt 201.2 lb

## 2018-04-20 DIAGNOSIS — Z1211 Encounter for screening for malignant neoplasm of colon: Secondary | ICD-10-CM

## 2018-04-20 MED ORDER — NA SULFATE-K SULFATE-MG SULF 17.5-3.13-1.6 GM/177ML PO SOLN: 1.0000 | Freq: Once | ORAL | 0 refills | Status: AC

## 2018-04-20 NOTE — Progress Notes (Signed)
No egg or soy allergy known to patient  No issues with past sedation with any surgeries  or procedures, no intubation problems  No diet pills per patient No home 02 use per patient  No blood thinners per patient  Pt denies issues with constipation  No A fib or A flutter  EMMI video sent to pt's e mail , pt declined    

## 2018-04-21 ENCOUNTER — Encounter: Payer: Self-pay | Admitting: Gastroenterology

## 2018-04-23 DIAGNOSIS — Z860101 Personal history of adenomatous and serrated colon polyps: Secondary | ICD-10-CM

## 2018-04-23 DIAGNOSIS — Z86010 Personal history of colonic polyps: Secondary | ICD-10-CM

## 2018-04-23 HISTORY — DX: Personal history of colonic polyps: Z86.010

## 2018-04-23 HISTORY — DX: Personal history of adenomatous and serrated colon polyps: Z86.0101

## 2018-05-02 ENCOUNTER — Ambulatory Visit (AMBULATORY_SURGERY_CENTER): Payer: BLUE CROSS/BLUE SHIELD | Admitting: Gastroenterology

## 2018-05-02 ENCOUNTER — Encounter: Payer: Self-pay | Admitting: Gastroenterology

## 2018-05-02 VITALS — BP 128/80 | HR 42 | Temp 97.5°F | Resp 13 | Ht 72.0 in | Wt 201.0 lb

## 2018-05-02 DIAGNOSIS — D123 Benign neoplasm of transverse colon: Secondary | ICD-10-CM

## 2018-05-02 DIAGNOSIS — D125 Benign neoplasm of sigmoid colon: Secondary | ICD-10-CM | POA: Diagnosis not present

## 2018-05-02 DIAGNOSIS — Z1211 Encounter for screening for malignant neoplasm of colon: Secondary | ICD-10-CM | POA: Diagnosis present

## 2018-05-02 DIAGNOSIS — K635 Polyp of colon: Secondary | ICD-10-CM | POA: Diagnosis not present

## 2018-05-02 DIAGNOSIS — K640 First degree hemorrhoids: Secondary | ICD-10-CM

## 2018-05-02 HISTORY — PX: COLONOSCOPY W/ POLYPECTOMY: SHX1380

## 2018-05-02 MED ORDER — SODIUM CHLORIDE 0.9 % IV SOLN: 500.0000 mL | Freq: Once | INTRAVENOUS | Status: DC

## 2018-05-02 NOTE — Op Note (Signed)
[place] Patient Name: Brett Swanson Procedure Date: 05/02/2018 1:54 PM MRN: [id number] Endoscopist: Gerrit Heck , MD Age: 53 Referring MD:  Date of Birth: 1965/12/23 Gender: Male Account #: [id number] Procedure:                Colonoscopy Indications:              Screening for colorectal malignant neoplasm, This                            is the patient's first colonoscopy Medicines:                Monitored Anesthesia Care Procedure:                Pre-Anesthesia Assessment:                           - Prior to the procedure, a History and Physical                            was performed, and patient medications and                            allergies were reviewed. The patient's tolerance of                            previous anesthesia was also reviewed. The risks                            and benefits of the procedure and the sedation                            options and risks were discussed with the patient.                            All questions were answered, and informed consent                            was obtained. Prior Anticoagulants: The patient has                            taken no previous anticoagulant or antiplatelet                            agents. ASA Grade Assessment: II - A patient with                            mild systemic disease. After reviewing the risks                            and benefits, the patient was deemed in                            satisfactory condition to undergo the procedure.  After obtaining informed consent, the colonoscope                            was passed under direct vision. Throughout the                            procedure, the patient's blood pressure, pulse, and                            oxygen saturations were monitored continuously. The                            Colonoscope was introduced through the anus and                            advanced to the the  terminal ileum. The colonoscopy                            was performed without difficulty. The patient                            tolerated the procedure well. The quality of the                            bowel preparation was adequate. The terminal ileum,                            ileocecal valve, appendiceal orifice, and rectum                            were photographed. Scope In: 2:03:10 PM Scope Out: 2:27:50 PM Scope Withdrawal Time: 0 hours 19 minutes 55 seconds  Total Procedure Duration: 0 hours 24 minutes 40 seconds  Findings:                 The perianal and digital rectal examinations were                            normal.                           Two semi-sessile polyps were found in the                            transverse colon. The polyps were 4 to 6 mm in                            size. These polyps were removed with a cold snare.                            Resection and retrieval were complete. Estimated                            blood loss was minimal.  Four sessile polyps were found in the sigmoid                            colon. The polyps were 2 to 3 mm in size. These                            polyps were removed with a cold biopsy forceps.                            Resection and retrieval were complete. Estimated                            blood loss was minimal.                           Non-bleeding internal hemorrhoids were found during                            retroflexion. The hemorrhoids were small.                           Retroflexion in the right colon was performed.                           The terminal ileum appeared normal. Complications:            No immediate complications. Estimated Blood Loss:     Estimated blood loss was minimal. Impression:               - Two 4 to 6 mm polyps in the transverse colon,                            removed with a cold snare. Resected and retrieved.                           -  Four 2 to 3 mm polyps in the sigmoid colon,                            removed with a cold biopsy forceps. Resected and                            retrieved.                           - Non-bleeding internal hemorrhoids.                           - The examined portion of the ileum was normal. Recommendation:           - Patient has a contact number available for                            emergencies. The signs and symptoms of potential  delayed complications were discussed with the                            patient. Return to normal activities tomorrow.                            Written discharge instructions were provided to the                            patient.                           - Resume previous diet today.                           - Continue present medications.                           - Await pathology results.                           - Repeat colonoscopy in 3 - 5 years for                            surveillance based on pathology results.                           - Return to GI office PRN.                           - Use fiber, for example Citrucel, Fibercon, Konsyl                            or Metamucil. Gerrit Heck, MD 05/02/2018 2:34:30 PM

## 2018-05-02 NOTE — Progress Notes (Signed)
Called to room to assist during endoscopic procedure.  Patient ID and intended procedure confirmed with present staff. Received instructions for my participation in the procedure from the performing physician.  

## 2018-05-02 NOTE — Patient Instructions (Signed)
Handout given on polyps Recommendation for 1st meal: Eggs Grits  Toast Pancakes/waffles Lean meat  NO DRIVING TODAY   YOU HAD AN ENDOSCOPIC PROCEDURE TODAY AT [facility]:   Refer to the procedure report that was given to you for any specific questions about what was found during the examination.  If the procedure report does not answer your questions, please call your gastroenterologist to clarify.  If you requested that your care partner not be given the details of your procedure findings, then the procedure report has been included in a sealed envelope for you to review at your convenience later.  YOU SHOULD EXPECT: Some feelings of bloating in the abdomen. Passage of more gas than usual.  Walking can help get rid of the air that was put into your GI tract during the procedure and reduce the bloating. If you had a lower endoscopy (such as a colonoscopy or flexible sigmoidoscopy) you may notice spotting of blood in your stool or on the toilet paper. If you underwent a bowel prep for your procedure, you may not have a normal bowel movement for a few days.  Please Note:  You might notice some irritation and congestion in your nose or some drainage.  This is from the oxygen used during your procedure.  There is no need for concern and it should clear up in a day or so.  SYMPTOMS TO REPORT IMMEDIATELY:   Following lower endoscopy (colonoscopy or flexible sigmoidoscopy):  Excessive amounts of blood in the stool  Significant tenderness or worsening of abdominal pains  Swelling of the abdomen that is new, acute  Fever of 100F or higher   For urgent or emergent issues, a gastroenterologist can be reached at any hour by calling [phone].   DIET:  We do recommend a small meal at first, but then you may proceed to your regular diet.  Drink plenty of fluids but you should avoid alcoholic beverages for 24 hours.  ACTIVITY:  You should plan to take it easy for the rest of  today and you should NOT DRIVE or use heavy machinery until tomorrow (because of the sedation medicines used during the test).    FOLLOW UP: Our staff will call the number listed on your records the next business day following your procedure to check on you and address any questions or concerns that you may have regarding the information given to you following your procedure. If we do not reach you, we will leave a message.  However, if you are feeling well and you are not experiencing any problems, there is no need to return our call.  We will assume that you have returned to your regular daily activities without incident.  If any biopsies were taken you will be contacted by phone or by letter within the next 1-3 weeks.  Please call us at [phone] if you have not heard about the biopsies in 3 weeks.    SIGNATURES/CONFIDENTIALITY: You and/or your care partner have signed paperwork which will be entered into your electronic medical record.  These signatures attest to the fact that that the information above on your After Visit Summary has been reviewed and is understood.  Full responsibility of the confidentiality of this discharge information lies with you and/or your care-partner.

## 2018-05-02 NOTE — Progress Notes (Signed)
PT taken to PACU. Monitors in place. VSS. Report given to RN. 

## 2018-05-03 ENCOUNTER — Telehealth: Payer: Self-pay

## 2018-05-03 NOTE — Telephone Encounter (Signed)
  Follow up Call-  Call back number 05/02/2018  Post procedure Call Back phone  # [phone]  Permission to leave phone message Yes  Some recent data might be hidden     Patient questions:  Do you have a fever, pain , or abdominal swelling? No. Pain Score  0 *  Have you tolerated food without any problems? Yes  Have you been able to return to your normal activities? Yes.    Do you have any questions about your discharge instructions: Diet   No. Medications  No. Follow up visit  No.  Do you have questions or concerns about your Care? No.  Actions: * If pain score is 4 or above: No action needed, pain <4.

## 2018-05-09 ENCOUNTER — Encounter: Payer: Self-pay | Admitting: Family Medicine

## 2018-05-12 ENCOUNTER — Encounter: Payer: Self-pay | Admitting: Gastroenterology

## 2018-05-13 ENCOUNTER — Encounter: Payer: Self-pay | Admitting: Family Medicine

## 2018-09-16 ENCOUNTER — Encounter (HOSPITAL_BASED_OUTPATIENT_CLINIC_OR_DEPARTMENT_OTHER): Payer: Self-pay | Admitting: Emergency Medicine

## 2018-09-16 ENCOUNTER — Other Ambulatory Visit: Payer: Self-pay

## 2018-09-16 ENCOUNTER — Emergency Department (HOSPITAL_BASED_OUTPATIENT_CLINIC_OR_DEPARTMENT_OTHER)
Admission: EM | Admit: 2018-09-16 | Discharge: 2018-09-16 | Disposition: A | Discharge status: Home / Self Care | Payer: BC Managed Care – PPO | Attending: Emergency Medicine | Admitting: Emergency Medicine

## 2018-09-16 DIAGNOSIS — Y999 Unspecified external cause status: Secondary | ICD-10-CM | POA: Diagnosis not present

## 2018-09-16 DIAGNOSIS — Y93G1 Activity, food preparation and clean up: Secondary | ICD-10-CM | POA: Diagnosis not present

## 2018-09-16 DIAGNOSIS — F17210 Nicotine dependence, cigarettes, uncomplicated: Secondary | ICD-10-CM | POA: Diagnosis not present

## 2018-09-16 DIAGNOSIS — S61211A Laceration without foreign body of left index finger without damage to nail, initial encounter: Secondary | ICD-10-CM | POA: Insufficient documentation

## 2018-09-16 DIAGNOSIS — I10 Essential (primary) hypertension: Secondary | ICD-10-CM | POA: Insufficient documentation

## 2018-09-16 DIAGNOSIS — W260XXA Contact with knife, initial encounter: Secondary | ICD-10-CM | POA: Insufficient documentation

## 2018-09-16 DIAGNOSIS — Y92000 Kitchen of unspecified non-institutional (private) residence as  the place of occurrence of the external cause: Secondary | ICD-10-CM | POA: Diagnosis not present

## 2018-09-16 NOTE — ED Notes (Signed)
Patient verbalizes understanding of discharge instructions. Opportunity for questioning and answers were provided. Armband removed by staff, pt discharged from ED.  

## 2018-09-16 NOTE — ED Triage Notes (Signed)
Pt present with a laceration to palmer surface of the left index finger. Pt was cutting a dinner roll and serrated knife cut his finger. Bleeding controlled.

## 2018-09-16 NOTE — ED Provider Notes (Signed)
[facility] Provider Note   CSN: [id number] Arrival date & time: 09/16/18  1936  History   Chief Complaint Chief Complaint  Patient presents with  . Laceration    HPI Brett Swanson is a 53 y.o. male with PMH significant for GERD, depression, HTN, HLD here for lacteration of L index finger with serrated kitchen knife while preparing dinner about 2 hours ago. He immediately cleaned the area with water. He was subsequently able to control the bleeding with direct pressure. He denies injuries to any other parts of the body.     Past Medical History:  Diagnosis Date  . Alcohol abuse    1 bottle of wine per day; began cutting back 2012  . Depression   . Family history of prostate cancer in father   . GERD (gastroesophageal reflux disease)   . Heart murmur, systolic    2 D ECHO normal 09/2010  . History of adenomatous polyp of colon 04/2018   Recall 3-5 yrs  . Hyperlipidemia   . Hypertension     Patient Active Problem List   Diagnosis Date Noted  . Maxillary sinusitis, acute 04/16/2015  . Health maintenance examination 09/01/2011  . Contact dermatitis 09/01/2011  . Heart murmur, systolic 19/62/2297  . Alcohol abuse 07/02/2010  . HYPERLIPIDEMIA 11/27/2008  . DEPRESSION 11/27/2008  . HYPERTENSION 11/27/2008  . GERD 11/27/2008    Past Surgical History:  Procedure Laterality Date  . ADENOIDECTOMY  1973  . COLONOSCOPY W/ POLYPECTOMY  05/02/2018   Adenomatous polyps (multiple). Recall 3-5 yrs  . EYE MUSCLE SURGERY  2005  . hernia 1972    . TONSILLECTOMY  1973  . TRANSTHORACIC ECHOCARDIOGRAM  10/03/2010   Normal except grd I DD.     Home Medications    Prior to Admission medications   Not on File    Family History Family History  Problem Relation Age of Onset  . Alcohol abuse Mother   . Cancer Mother 1       Breast, throat, lung  . Esophageal cancer Mother   . Alcohol abuse Father   . Heart disease Father        enlarged  heart  . COPD Father   . Hypertension Father   . Cancer Father 36       prostate  . Rectal cancer Neg Hx   . Stomach cancer Neg Hx   . Colon cancer Neg Hx     Social History Social History   Tobacco Use  . Smoking status: Light Tobacco Smoker    Years: 4.00    Types: Cigarettes  . Smokeless tobacco: Never Used  . Tobacco comment: <1 pk per week  Substance Use Topics  . Alcohol use: Not Currently    Comment: Quit 10/16/17  . Drug use: No     Allergies   Patient has no known allergies.   Review of Systems Review of Systems  Constitutional: Negative for fever.  Respiratory: Negative for shortness of breath.   Cardiovascular: Negative for chest pain.  Hematological: Bruises/bleeds easily.   Physical Exam Updated Vital Signs BP (!) 180/87   Pulse 64   Temp 98.4 F (36.9 C) (Oral)   Resp 18   Ht 6' (1.829 m)   Wt 89.8 kg   SpO2 96%   BMI 26.85 kg/m   Physical Exam Constitutional:      General: He is not in acute distress.    Appearance: Normal appearance. He is not ill-appearing, toxic-appearing  or diaphoretic.  HENT:     Head: Normocephalic.  Cardiovascular:     Rate and Rhythm: Normal rate and regular rhythm.     Heart sounds: Normal heart sounds.  Pulmonary:     Effort: Pulmonary effort is normal. No respiratory distress.     Breath sounds: Normal breath sounds.  Musculoskeletal: Normal range of motion.     Comments: Full ROM of fingers of L hand. 2+ DP pulse. Intact grip strength.  Skin:    General: Skin is warm and dry.     Capillary Refill: Capillary refill takes less than 2 seconds.     Comments: ~7DZ sliced laceration of pad of L index finger, no active bleeding or purulence, no surrounding erythema or rash.   Neurological:     Mental Status: He is alert.    ED Treatments / Results  Labs (all labs ordered are listed, but only abnormal results are displayed) Labs Reviewed - No data to display  EKG None  Radiology No results found.   Procedures .Marland KitchenLaceration Repair  Date/Time: 09/16/2018 8:21 PM Performed by: Rory Percy, DO Authorized by: Gareth Morgan, MD   Consent:    Consent obtained:  Verbal   Consent given by:  Patient   Risks discussed:  Infection, pain and need for additional repair Anesthesia (see MAR for exact dosages):    Anesthesia method:  None Laceration details:    Location:  Finger   Finger location:  L index finger   Length (cm):  1 Pre-procedure details:    Preparation:  Patient was prepped and draped in usual sterile fashion Exploration:    Hemostasis achieved with:  Direct pressure   Wound exploration: wound explored through full range of motion     Wound extent: no foreign bodies/material noted, no muscle damage noted, no nerve damage noted, no tendon damage noted, no underlying fracture noted and no vascular damage noted     Contaminated: no   Treatment:    Area cleansed with:  Saline   Amount of cleaning:  Standard   Irrigation solution:  Sterile saline   Irrigation volume:  20cc   Irrigation method:  Syringe   Visualized foreign bodies/material removed: no   Skin repair:    Repair method:  Tissue adhesive Approximation:    Approximation:  Close Post-procedure details:    Dressing:  Open (no dressing)   Patient tolerance of procedure:  Tolerated well, no immediate complications   (including critical care time)  Medications Ordered in ED Medications - No data to display   Initial Impression / Assessment and Plan / ED Course  I have reviewed the triage vital signs and the nursing notes.  Pertinent labs & imaging results that were available during my care of the patient were reviewed by me and considered in my medical decision making (see chart for details).   53 yo M w/ h/o GERD, depression, HTN, HLD here with clean sliced laceration to L index finger a few hours ago. Area cleaned in usual fashion and repaired with Dermabond without any immediate complications. Stable  for discharge with return precautions reviewed. Advised to f/u with PCP for high blood pressure.     Final Clinical Impressions(s) / ED Diagnoses   Final diagnoses:  Laceration of left index finger without foreign body without damage to nail, initial encounter    ED Discharge Orders    None       Rory Percy, DO 09/16/18 2025    Gareth Morgan, MD 09/17/18 [phone]

## 2018-09-16 NOTE — Discharge Instructions (Addendum)
You were seen today for skin laceration, this was repaired with dermabond. Keep the area clean and dry for at least 24 hours, do not submerge in water. If you notice the area get swollen, red, or notice discharge from the wound, come back to be seen. See your primary doctor to follow up on your blood pressure.

## 2018-09-16 NOTE — ED Notes (Signed)
ED Provider at bedside. 

## 2019-04-17 ENCOUNTER — Ambulatory Visit (INDEPENDENT_AMBULATORY_CARE_PROVIDER_SITE_OTHER): Payer: BC Managed Care – PPO | Admitting: Family Medicine

## 2019-04-17 ENCOUNTER — Other Ambulatory Visit: Payer: Self-pay

## 2019-04-17 ENCOUNTER — Encounter: Payer: Self-pay | Admitting: Family Medicine

## 2019-04-17 ENCOUNTER — Encounter: Payer: BLUE CROSS/BLUE SHIELD | Admitting: Family Medicine

## 2019-04-17 VITALS — BP 154/100 | HR 56 | Temp 97.7°F | Resp 16 | Ht 72.5 in | Wt 210.4 lb

## 2019-04-17 DIAGNOSIS — R7301 Impaired fasting glucose: Secondary | ICD-10-CM

## 2019-04-17 DIAGNOSIS — Z0000 Encounter for general adult medical examination without abnormal findings: Secondary | ICD-10-CM | POA: Diagnosis not present

## 2019-04-17 DIAGNOSIS — Z125 Encounter for screening for malignant neoplasm of prostate: Secondary | ICD-10-CM

## 2019-04-17 DIAGNOSIS — Z23 Encounter for immunization: Secondary | ICD-10-CM | POA: Diagnosis not present

## 2019-04-17 DIAGNOSIS — E7800 Pure hypercholesterolemia, unspecified: Secondary | ICD-10-CM

## 2019-04-17 DIAGNOSIS — I10 Essential (primary) hypertension: Secondary | ICD-10-CM

## 2019-04-17 LAB — CBC WITH DIFFERENTIAL/PLATELET
Basophils Absolute: 0 10*3/uL (ref 0.0–0.1)
Basophils Relative: 0.7 % (ref 0.0–3.0)
Eosinophils Absolute: 0.1 10*3/uL (ref 0.0–0.7)
Eosinophils Relative: 2 % (ref 0.0–5.0)
HCT: 44.5 % (ref 39.0–52.0)
Hemoglobin: 15.5 g/dL (ref 13.0–17.0)
Lymphocytes Relative: 32.4 % (ref 12.0–46.0)
Lymphs Abs: 1.4 10*3/uL (ref 0.7–4.0)
MCHC: 34.9 g/dL (ref 30.0–36.0)
MCV: 92.7 fl (ref 78.0–100.0)
Monocytes Absolute: 0.4 10*3/uL (ref 0.1–1.0)
Monocytes Relative: 8.8 % (ref 3.0–12.0)
Neutro Abs: 2.4 10*3/uL (ref 1.4–7.7)
Neutrophils Relative %: 56.1 % (ref 43.0–77.0)
Platelets: 189 10*3/uL (ref 150.0–400.0)
RBC: 4.8 Mil/uL (ref 4.22–5.81)
RDW: 12.8 % (ref 11.5–15.5)
WBC: 4.3 10*3/uL (ref 4.0–10.5)

## 2019-04-17 LAB — TSH: TSH: 2.1 u[IU]/mL (ref 0.35–4.50)

## 2019-04-17 LAB — COMPREHENSIVE METABOLIC PANEL
ALT: 37 U/L (ref 0–53)
AST: 16 U/L (ref 0–37)
Albumin: 4.6 g/dL (ref 3.5–5.2)
Alkaline Phosphatase: 56 U/L (ref 39–117)
BUN: 16 mg/dL (ref 6–23)
CO2: 30 mEq/L (ref 19–32)
Calcium: 9.4 mg/dL (ref 8.4–10.5)
Chloride: 102 mEq/L (ref 96–112)
Creatinine, Ser: 0.96 mg/dL (ref 0.40–1.50)
GFR: 82 mL/min (ref >60.00)
Glucose, Bld: 117 mg/dL — ABNORMAL HIGH (ref 70–99)
Potassium: 4.3 mEq/L (ref 3.5–5.1)
Sodium: 137 mEq/L (ref 135–145)
Total Bilirubin: 0.9 mg/dL (ref 0.2–1.2)
Total Protein: 6.7 g/dL (ref 6.0–8.3)

## 2019-04-17 LAB — LIPID PANEL
Cholesterol: 218 mg/dL — ABNORMAL HIGH (ref 0–200)
HDL: 43.5 mg/dL (ref >39.00)
LDL Cholesterol: 140 mg/dL — ABNORMAL HIGH (ref 0–99)
NonHDL: 174.47
Total CHOL/HDL Ratio: 5
Triglycerides: 172 mg/dL — ABNORMAL HIGH (ref 0.0–149.0)
VLDL: 34.4 mg/dL (ref 0.0–40.0)

## 2019-04-17 LAB — HEMOGLOBIN A1C: Hgb A1c MFr Bld: 5.4 % (ref 4.6–6.5)

## 2019-04-17 LAB — PSA: PSA: 1.79 ng/mL (ref 0.10–4.00)

## 2019-04-17 MED ORDER — LISINOPRIL 10 MG PO TABS: 10.0000 mg | ORAL_TABLET | Freq: Every day | ORAL | 0 refills | Status: DC

## 2019-04-17 NOTE — Patient Instructions (Signed)

## 2019-04-17 NOTE — Addendum Note (Signed)
Addended by: Deveron Furlong D on: 04/17/2019 09:10 AM   Modules accepted: Orders

## 2019-04-17 NOTE — Progress Notes (Signed)
Office Note 04/17/2019  CC:  Chief Complaint  Patient presents with  . Annual Exam    pt is fasting    HPI:  Brett Swanson is a 54 y.o. White male who is here for annual health maintenance exam.  HTN: not doing any home monitoring BP up here today. No exercise. Feeling lazy working at home. Diet: "not bad".  Some snacking more since working at home.  Wt 209 stable per pt the last year per home checks. Alc: nothing in excess at all.  Past Medical History:  Diagnosis Date  . Alcohol abuse    1 bottle of wine per day; began cutting back 2012  . Depression   . Family history of prostate cancer in father   . GERD (gastroesophageal reflux disease)   . Heart murmur, systolic    2 D ECHO normal 09/2010  . History of adenomatous polyp of colon 04/2018   Recall 3-5 yrs  . Hyperlipidemia   . Hypertension     Past Surgical History:  Procedure Laterality Date  . ADENOIDECTOMY  1973  . COLONOSCOPY W/ POLYPECTOMY  05/02/2018   Adenomatous polyps (multiple). Recall 3-5 yrs  . EYE MUSCLE SURGERY  2005  . hernia 1972    . TONSILLECTOMY  1973  . TRANSTHORACIC ECHOCARDIOGRAM  10/03/2010   Normal except grd I DD.    Family History  Problem Relation Age of Onset  . Alcohol abuse Mother   . Cancer Mother 64       Breast, throat, lung  . Esophageal cancer Mother   . Alcohol abuse Father   . Heart disease Father        enlarged heart  . COPD Father   . Hypertension Father   . Cancer Father 57       prostate  . Rectal cancer Neg Hx   . Stomach cancer Neg Hx   . Colon cancer Neg Hx     Social History   Socioeconomic History  . Marital status: Married    Spouse name: Joycelyn Schmid  . Number of children: 1  . Years of education: 4  . Highest education level: Not on file  Occupational History  . Occupation: Scientist, clinical (histocompatibility and immunogenetics): [employer]    Comment: Sr. Psychologist, sport and exercise  Tobacco Use  . Smoking status: Light Tobacco Smoker    Years: 4.00    Types: Cigarettes  . Smokeless  tobacco: Never Used  . Tobacco comment: <1 pk per week  Substance and Sexual Activity  . Alcohol use: Not Currently    Comment: Quit 10/16/17  . Drug use: No  . Sexual activity: Yes  Other Topics Concern  . Not on file  Social History Narrative   Married, one daughter.  Psychologist, sport and exercise for [employer].  Exercise: fast walking.   Nonsmoker.  Alcohol: bottle of wine per day--cutting back.  No drug abuse.   Social Determinants of Health   Financial Resource Strain:   . Difficulty of Paying Living Expenses: Not on file  Food Insecurity:   . Worried About Charity fundraiser in the Last Year: Not on file  . Ran Out of Food in the Last Year: Not on file  Transportation Needs:   . Lack of Transportation (Medical): Not on file  . Lack of Transportation (Non-Medical): Not on file  Physical Activity:   . Days of Exercise per Week: Not on file  . Minutes of Exercise per Session: Not on file  Stress:   .  Feeling of Stress : Not on file  Social Connections:   . Frequency of Communication with Friends and Family: Not on file  . Frequency of Social Gatherings with Friends and Family: Not on file  . Attends Religious Services: Not on file  . Active Member of Clubs or Organizations: Not on file  . Attends Archivist Meetings: Not on file  . Marital Status: Not on file  Intimate Partner Violence:   . Fear of Current or Ex-Partner: Not on file  . Emotionally Abused: Not on file  . Physically Abused: Not on file  . Sexually Abused: Not on file    Outpatient Medications Prior to Visit  Medication Sig Dispense Refill  . Calcium Carbonate Antacid (TUMS PO) Take by mouth as needed.     No facility-administered medications prior to visit.    No Known Allergies  ROS Review of Systems  Constitutional: Negative for appetite change, chills, fatigue and fever.  HENT: Negative for congestion, dental problem, ear pain and sore throat.   Eyes: Negative for discharge, redness and  visual disturbance.  Respiratory: Negative for cough, chest tightness, shortness of breath and wheezing.   Cardiovascular: Negative for chest pain, palpitations and leg swelling.  Gastrointestinal: Negative for abdominal pain, blood in stool, diarrhea, nausea and vomiting.  Genitourinary: Negative for difficulty urinating, dysuria, flank pain, frequency, hematuria and urgency.  Musculoskeletal: Negative for arthralgias, back pain, joint swelling, myalgias and neck stiffness.  Skin: Negative for pallor and rash.  Neurological: Negative for dizziness, speech difficulty, weakness and headaches.  Hematological: Negative for adenopathy. Does not bruise/bleed easily.  Psychiatric/Behavioral: Negative for confusion and sleep disturbance. The patient is not nervous/anxious.     PE; Repeat manual bp at end of visit was 130/78 Blood pressure (!) 154/100, pulse (!) 56, temperature 97.7 F (36.5 C), temperature source Temporal, resp. rate 16, height 6' 0.5" (1.842 m), weight 210 lb 6.4 oz (95.4 kg), SpO2 95 %. Body mass index is 28.14 kg/m.  Gen: Alert, well appearing.  Patient is oriented to person, place, time, and situation. AFFECT: pleasant, lucid thought and speech. ENT: Ears: EACs clear, normal epithelium.  TMs with good light reflex and landmarks bilaterally.  Eyes: no injection, icteris, swelling, or exudate.  EOMI, PERRLA. Nose: no drainage or turbinate edema/swelling.  No injection or focal lesion.  Mouth: lips without lesion/swelling.  Oral mucosa pink and moist.  Dentition intact and without obvious caries or gingival swelling.  Oropharynx without erythema, exudate, or swelling.  Neck: supple/nontender.  No LAD, mass, or TM.  Carotid pulses 2+ bilaterally, without bruits. CV: RRR, no m/r/g.   LUNGS: CTA bilat, nonlabored resps, good aeration in all lung fields. ABD: soft, NT, ND, BS normal.  No hepatospenomegaly or mass.  No bruits. EXT: no clubbing, cyanosis, or edema.  Musculoskeletal: no  joint swelling, erythema, warmth, or tenderness.  ROM of all joints intact. Skin - no sores or suspicious lesions or rashes or color changes Rectal exam: negative without mass, lesions or tenderness, PROSTATE EXAM: smooth and symmetric without nodules or tenderness.   Pertinent labs:  Lab Results  Component Value Date   TSH 1.18 04/14/2018   Lab Results  Component Value Date   WBC 3.8 (L) 04/14/2018   HGB 16.0 04/14/2018   HCT 46.7 04/14/2018   MCV 93.3 04/14/2018   PLT 192.0 04/14/2018   Lab Results  Component Value Date   CREATININE 0.96 04/14/2018   BUN 16 04/14/2018   NA 139 04/14/2018  K 4.4 04/14/2018   CL 102 04/14/2018   CO2 31 04/14/2018   Lab Results  Component Value Date   ALT 51 04/14/2018   AST 17 04/14/2018   ALKPHOS 49 04/14/2018   BILITOT 1.1 04/14/2018   Lab Results  Component Value Date   CHOL 201 (H) 04/14/2018   Lab Results  Component Value Date   HDL 41.00 04/14/2018   Lab Results  Component Value Date   LDLCALC 133 (H) 04/14/2018   Lab Results  Component Value Date   TRIG 135.0 04/14/2018   Lab Results  Component Value Date   CHOLHDL 5 04/14/2018   Lab Results  Component Value Date   PSA 1.07 04/14/2018   PSA 0.97 08/24/2011   PSA 0.60 07/02/2010   Lab Results  Component Value Date   HGBA1C 5.4 04/15/2018   ASSESSMENT AND PLAN:   1) HTN: needs to start antihypertensive. Will start lisinopril 10mg  qd.  Repeat bmet 1 week. F/u HTN o/v 2 wks.  Home bp monitoring recommended.  2) Health maintenance exam: Reviewed age and gender appropriate health maintenance issues (prudent diet, regular exercise, health risks of tobacco and excessive alcohol, use of seatbelts, fire alarms in home, use of sunscreen).  Also reviewed age and gender appropriate health screening as well as vaccine recommendations. Vaccines: Tdap UTD.  Flu->given today.   Shingrix->#1 given today. Labs: fasting HP, Hba1c (IFG), PSA. Prostate ca screening: DRE  normal,PSA. Colon ca screening: polyps 04/2018->recall 3-5 yrs.  An After Visit Summary was printed and given to the patient.  FOLLOW UP:  No follow-ups on file.  Signed:  Crissie Sickles, MD           04/17/2019

## 2019-04-18 ENCOUNTER — Encounter: Payer: Self-pay | Admitting: Family Medicine

## 2019-04-18 ENCOUNTER — Other Ambulatory Visit: Payer: Self-pay

## 2019-04-18 DIAGNOSIS — E7800 Pure hypercholesterolemia, unspecified: Secondary | ICD-10-CM

## 2019-04-18 MED ORDER — ATORVASTATIN CALCIUM 20 MG PO TABS: 20.0000 mg | ORAL_TABLET | Freq: Every day | ORAL | 2 refills | Status: DC

## 2019-04-24 ENCOUNTER — Other Ambulatory Visit: Payer: Self-pay

## 2019-04-24 ENCOUNTER — Ambulatory Visit (INDEPENDENT_AMBULATORY_CARE_PROVIDER_SITE_OTHER): Payer: BC Managed Care – PPO | Admitting: Family Medicine

## 2019-04-24 DIAGNOSIS — I10 Essential (primary) hypertension: Secondary | ICD-10-CM

## 2019-04-24 DIAGNOSIS — E7800 Pure hypercholesterolemia, unspecified: Secondary | ICD-10-CM

## 2019-04-24 LAB — BASIC METABOLIC PANEL
BUN: 17 mg/dL (ref 6–23)
CO2: 26 mEq/L (ref 19–32)
Calcium: 9.4 mg/dL (ref 8.4–10.5)
Chloride: 104 mEq/L (ref 96–112)
Creatinine, Ser: 0.9 mg/dL (ref 0.40–1.50)
GFR: 88.34 mL/min (ref >60.00)
Glucose, Bld: 112 mg/dL — ABNORMAL HIGH (ref 70–99)
Potassium: 4.4 mEq/L (ref 3.5–5.1)
Sodium: 138 mEq/L (ref 135–145)

## 2019-04-28 ENCOUNTER — Other Ambulatory Visit: Payer: Self-pay

## 2019-05-01 ENCOUNTER — Ambulatory Visit (INDEPENDENT_AMBULATORY_CARE_PROVIDER_SITE_OTHER): Payer: BC Managed Care – PPO | Admitting: Family Medicine

## 2019-05-01 ENCOUNTER — Encounter: Payer: Self-pay | Admitting: Family Medicine

## 2019-05-01 ENCOUNTER — Other Ambulatory Visit: Payer: Self-pay

## 2019-05-01 VITALS — BP 130/78 | HR 52 | Temp 97.7°F | Resp 16 | Ht 72.5 in | Wt 211.2 lb

## 2019-05-01 DIAGNOSIS — E7800 Pure hypercholesterolemia, unspecified: Secondary | ICD-10-CM

## 2019-05-01 DIAGNOSIS — I10 Essential (primary) hypertension: Secondary | ICD-10-CM | POA: Diagnosis not present

## 2019-05-01 MED ORDER — LISINOPRIL 20 MG PO TABS: 20.0000 mg | ORAL_TABLET | Freq: Every day | ORAL | 0 refills | Status: DC

## 2019-05-01 NOTE — Progress Notes (Signed)
OFFICE VISIT  05/01/2019   CC:  Chief Complaint  Patient presents with  . Follow-up    hypertension, pt is fasting   HPI:    Patient is a 54 y.o. Caucasian male who presents for 2 wk f/u HTN. A/P as of last visit: "HTN: needs to start antihypertensive. Will start lisinopril 10mg  qd.  Repeat bmet 1 week. F/u HTN o/v 2 wks.  Home bp monitoring recommended."  Interim hx: I also started pt on atorvastatin for HLD 2 wks ago. Tolerating meds fine. Has improved diet some.  Started exercising again. BPs 140-150s, 90s-100s.   Past Medical History:  Diagnosis Date  . Alcohol abuse    1 bottle of wine per day; began cutting back 2012  . Depression   . Family history of prostate cancer in father   . GERD (gastroesophageal reflux disease)   . Heart murmur, systolic    2 D ECHO normal 09/2010  . History of adenomatous polyp of colon 04/2018   Recall 3-5 yrs  . Hyperlipidemia    Statin recommended 03/2019  . Hypertension     Past Surgical History:  Procedure Laterality Date  . ADENOIDECTOMY  1973  . COLONOSCOPY W/ POLYPECTOMY  05/02/2018   Adenomatous polyps (multiple). Recall 3-5 yrs  . EYE MUSCLE SURGERY  2005  . hernia 1972    . TONSILLECTOMY  1973  . TRANSTHORACIC ECHOCARDIOGRAM  10/03/2010   Normal except grd I DD.    Outpatient Medications Prior to Visit  Medication Sig Dispense Refill  . atorvastatin (LIPITOR) 20 MG tablet Take 1 tablet (20 mg total) by mouth daily. 30 tablet 2  . Calcium Carbonate Antacid (TUMS PO) Take by mouth as needed.    Marland Kitchen lisinopril (ZESTRIL) 10 MG tablet Take 1 tablet (10 mg total) by mouth daily. 30 tablet 0   No facility-administered medications prior to visit.    No Known Allergies  ROS As per HPI  PE: Initial bp today 155/94.  Manual bp check later in visit was 130/78 Blood pressure 130/78, pulse (!) 52, temperature 97.7 F (36.5 C), temperature source Temporal, resp. rate 16, height 6' 0.5" (1.842 m), weight 211 lb 3.2 oz (95.8  kg), SpO2 99 %. Gen: Alert, well appearing.  Patient is oriented to person, place, time, and situation. AFFECT: pleasant, lucid thought and speech. CV: RRR, no m/r/g.   LUNGS: CTA bilat, nonlabored resps, good aeration in all lung fields.  LABS:  Lab Results  Component Value Date   TSH 2.10 04/17/2019   Lab Results  Component Value Date   WBC 4.3 04/17/2019   HGB 15.5 04/17/2019   HCT 44.5 04/17/2019   MCV 92.7 04/17/2019   PLT 189.0 04/17/2019   Lab Results  Component Value Date   CREATININE 0.90 04/24/2019   BUN 17 04/24/2019   NA 138 04/24/2019   K 4.4 04/24/2019   CL 104 04/24/2019   CO2 26 04/24/2019   Lab Results  Component Value Date   ALT 37 04/17/2019   AST 16 04/17/2019   ALKPHOS 56 04/17/2019   BILITOT 0.9 04/17/2019   Lab Results  Component Value Date   CHOL 218 (H) 04/17/2019   Lab Results  Component Value Date   HDL 43.50 04/17/2019   Lab Results  Component Value Date   LDLCALC 140 (H) 04/17/2019   Lab Results  Component Value Date   TRIG 172.0 (H) 04/17/2019   Lab Results  Component Value Date   CHOLHDL 5 04/17/2019  Lab Results  Component Value Date   PSA 1.79 04/17/2019   PSA 1.07 04/14/2018   PSA 0.97 08/24/2011   Lab Results  Component Value Date   HGBA1C 5.4 04/17/2019    IMPRESSION AND PLAN:  1) HTN, not well controlled. Just started lisinopril 10mg  qd. Increase to 20mg  qd and continue home bp/hr monitoring. Continue to work on diet/exercise changes. His lytes/cr were stable after starting lisinopril. No labs today.  2) HLD: atorva started 2 wks ago, tolerating this well.  An After Visit Summary was printed and given to the patient.  FOLLOW UP: Return in about 2 weeks (around 05/15/2019) for f/u HTN.  Signed:  Crissie Sickles, MD           05/01/2019

## 2019-05-15 ENCOUNTER — Encounter: Payer: Self-pay | Admitting: Family Medicine

## 2019-05-15 ENCOUNTER — Other Ambulatory Visit: Payer: Self-pay

## 2019-05-15 ENCOUNTER — Ambulatory Visit (INDEPENDENT_AMBULATORY_CARE_PROVIDER_SITE_OTHER): Payer: BC Managed Care – PPO | Admitting: Family Medicine

## 2019-05-15 VITALS — BP 131/82 | HR 49 | Temp 97.6°F | Resp 16 | Ht 72.5 in | Wt 212.6 lb

## 2019-05-15 DIAGNOSIS — I10 Essential (primary) hypertension: Secondary | ICD-10-CM

## 2019-05-15 MED ORDER — LISINOPRIL 20 MG PO TABS: 20.0000 mg | ORAL_TABLET | Freq: Every day | ORAL | 3 refills | Status: DC

## 2019-05-15 NOTE — Progress Notes (Signed)
OFFICE VISIT  05/15/2019   CC:  Chief Complaint  Patient presents with  . Follow-up    hypertension, pt is fasting     HPI:    Patient is a 54 y.o. Caucasian male who presents for 2 wk f/u HTN. A/P as of last visit: "HTN, not well controlled. Just started lisinopril 10mg  qd. Increase to 20mg  qd and continue home bp/hr monitoring. Continue to work on diet/exercise changes. His lytes/cr were stable after starting lisinopril. No labs today."  Interim hx: Doing well, no complaints. Occ home bp check being done: low 130s/low 80s.  Past Medical History:  Diagnosis Date  . Alcohol abuse    1 bottle of wine per day; began cutting back 2012  . Depression   . Family history of prostate cancer in father   . GERD (gastroesophageal reflux disease)   . Heart murmur, systolic    2 D ECHO normal 09/2010  . History of adenomatous polyp of colon 04/2018   Recall 3-5 yrs  . Hyperlipidemia    Statin initiated 03/2019  . Hypertension     Past Surgical History:  Procedure Laterality Date  . ADENOIDECTOMY  1973  . COLONOSCOPY W/ POLYPECTOMY  05/02/2018   Adenomatous polyps (multiple). Recall 3-5 yrs  . EYE MUSCLE SURGERY  2005  . hernia 1972    . TONSILLECTOMY  1973  . TRANSTHORACIC ECHOCARDIOGRAM  10/03/2010   Normal except grd I DD.    Outpatient Medications Prior to Visit  Medication Sig Dispense Refill  . atorvastatin (LIPITOR) 20 MG tablet Take 1 tablet (20 mg total) by mouth daily. 30 tablet 2  . Calcium Carbonate Antacid (TUMS PO) Take by mouth as needed.    Marland Kitchen lisinopril (ZESTRIL) 20 MG tablet Take 1 tablet (20 mg total) by mouth daily. 30 tablet 0   No facility-administered medications prior to visit.    No Known Allergies  ROS As per HPI  PE: Blood pressure 131/82, pulse (!) 49, temperature 97.6 F (36.4 C), temperature source Temporal, resp. rate 16, height 6' 0.5" (1.842 m), weight 212 lb 9.6 oz (96.4 kg), SpO2 100 %. Gen: Alert, well appearing.  Patient is  oriented to person, place, time, and situation. AFFECT: pleasant, lucid thought and speech. No further exam today.  LABS:    Chemistry      Component Value Date/Time   NA 138 04/24/2019 0918   K 4.4 04/24/2019 0918   CL 104 04/24/2019 0918   CO2 26 04/24/2019 0918   BUN 17 04/24/2019 0918   CREATININE 0.90 04/24/2019 0918      Component Value Date/Time   CALCIUM 9.4 04/24/2019 0918   ALKPHOS 56 04/17/2019 0848   AST 16 04/17/2019 0848   ALT 37 04/17/2019 0848   BILITOT 0.9 04/17/2019 0848      IMPRESSION AND PLAN:  HTN; well controlled on lisinopril 20 mg qd.   Continue this dosing.  90 d supply w/RF x 3 eRx'd today. Discussed ongoing TLC as well.  An After Visit Summary was printed and given to the patient.  FOLLOW UP: as needed  Signed:  Crissie Sickles, MD           05/15/2019

## 2019-06-15 ENCOUNTER — Ambulatory Visit: Payer: BC Managed Care – PPO | Attending: Internal Medicine

## 2019-06-15 DIAGNOSIS — Z23 Encounter for immunization: Secondary | ICD-10-CM

## 2019-06-15 NOTE — Progress Notes (Signed)
   Covid-19 Vaccination Clinic  Name:  Brett Swanson    MRN: [id number] DOB: [birth year]  06/15/2019  Mr. Brett Swanson was observed post Covid-19 immunization for 15 minutes without incident. He was provided with Vaccine Information Sheet and instruction to access the V-Safe system.   Mr. Brett Swanson was instructed to call 911 with any severe reactions post vaccine: Marland Kitchen Difficulty breathing  . Swelling of face and throat  . A fast heartbeat  . A bad rash all over body  . Dizziness and weakness   Immunizations Administered    Name Date Dose VIS Date Route   Pfizer COVID-19 Vaccine 06/15/2019  2:53 PM 0.3 mL 03/03/2019 Intramuscular   Manufacturer: [place]   Lot: [id number]   [place]: [id number]

## 2019-06-16 ENCOUNTER — Other Ambulatory Visit: Payer: Self-pay | Admitting: Family Medicine

## 2019-07-10 ENCOUNTER — Ambulatory Visit: Payer: BC Managed Care – PPO | Attending: Internal Medicine

## 2019-07-10 DIAGNOSIS — Z23 Encounter for immunization: Secondary | ICD-10-CM

## 2019-07-10 NOTE — Progress Notes (Signed)
   Covid-19 Vaccination Clinic  Name:  Brett Swanson    MRN: [id number] DOB: 1965/09/29  07/10/2019  [name] was observed post Covid-19 immunization for 15 minutes without incident. He was provided with Vaccine Information Sheet and instruction to access the V-Safe system.   [name] was instructed to call 911 with any severe reactions post vaccine: Marland Kitchen Difficulty breathing  . Swelling of face and throat  . A fast heartbeat  . A bad rash all over body  . Dizziness and weakness   Immunizations Administered    Name Date Dose VIS Date Route   Pfizer COVID-19 Vaccine 07/10/2019  2:47 PM 0.3 mL 05/17/2018 Intramuscular   Manufacturer: [place]   Lot: [id number]   [place]: [id number]

## 2019-08-15 ENCOUNTER — Ambulatory Visit: Payer: BC Managed Care – PPO

## 2019-08-16 ENCOUNTER — Other Ambulatory Visit: Payer: Self-pay | Admitting: Family Medicine

## 2019-08-16 NOTE — Telephone Encounter (Signed)
Pt coming in for labs tomorrow.

## 2019-08-17 ENCOUNTER — Other Ambulatory Visit: Payer: Self-pay

## 2019-08-17 ENCOUNTER — Ambulatory Visit (INDEPENDENT_AMBULATORY_CARE_PROVIDER_SITE_OTHER): Payer: BC Managed Care – PPO | Admitting: Family Medicine

## 2019-08-17 DIAGNOSIS — Z23 Encounter for immunization: Secondary | ICD-10-CM | POA: Diagnosis not present

## 2019-08-18 ENCOUNTER — Ambulatory Visit (INDEPENDENT_AMBULATORY_CARE_PROVIDER_SITE_OTHER): Payer: BC Managed Care – PPO

## 2019-08-18 DIAGNOSIS — E7800 Pure hypercholesterolemia, unspecified: Secondary | ICD-10-CM

## 2019-08-18 LAB — LIPID PANEL
Cholesterol: 122 mg/dL (ref 0–200)
HDL: 47.2 mg/dL (ref >39.00)
LDL Cholesterol: 55 mg/dL (ref 0–99)
NonHDL: 74.96
Total CHOL/HDL Ratio: 3
Triglycerides: 99 mg/dL (ref 0.0–149.0)
VLDL: 19.8 mg/dL (ref 0.0–40.0)

## 2019-08-23 ENCOUNTER — Other Ambulatory Visit: Payer: Self-pay

## 2019-08-23 MED ORDER — LISINOPRIL 20 MG PO TABS: 20.0000 mg | ORAL_TABLET | Freq: Every day | ORAL | 3 refills | Status: DC

## 2020-04-16 ENCOUNTER — Other Ambulatory Visit: Payer: Self-pay

## 2020-04-17 ENCOUNTER — Ambulatory Visit (INDEPENDENT_AMBULATORY_CARE_PROVIDER_SITE_OTHER): Payer: BC Managed Care – PPO | Admitting: Family Medicine

## 2020-04-17 ENCOUNTER — Encounter: Payer: Self-pay | Admitting: Family Medicine

## 2020-04-17 VITALS — BP 122/73 | HR 52 | Temp 97.4°F | Resp 16 | Ht 72.0 in | Wt 215.8 lb

## 2020-04-17 DIAGNOSIS — E7800 Pure hypercholesterolemia, unspecified: Secondary | ICD-10-CM

## 2020-04-17 DIAGNOSIS — Z125 Encounter for screening for malignant neoplasm of prostate: Secondary | ICD-10-CM

## 2020-04-17 DIAGNOSIS — R7301 Impaired fasting glucose: Secondary | ICD-10-CM

## 2020-04-17 DIAGNOSIS — L989 Disorder of the skin and subcutaneous tissue, unspecified: Secondary | ICD-10-CM

## 2020-04-17 DIAGNOSIS — Z0000 Encounter for general adult medical examination without abnormal findings: Secondary | ICD-10-CM | POA: Diagnosis not present

## 2020-04-17 DIAGNOSIS — I10 Essential (primary) hypertension: Secondary | ICD-10-CM | POA: Diagnosis not present

## 2020-04-17 LAB — CBC WITH DIFFERENTIAL/PLATELET
Basophils Absolute: 0 K/uL (ref 0.0–0.1)
Basophils Relative: 0.8 % (ref 0.0–3.0)
Eosinophils Absolute: 0.1 K/uL (ref 0.0–0.7)
Eosinophils Relative: 2.2 % (ref 0.0–5.0)
HCT: 42.3 % (ref 39.0–52.0)
Hemoglobin: 15 g/dL (ref 13.0–17.0)
Lymphocytes Relative: 24.9 % (ref 12.0–46.0)
Lymphs Abs: 1.2 K/uL (ref 0.7–4.0)
MCHC: 35.4 g/dL (ref 30.0–36.0)
MCV: 94 fl (ref 78.0–100.0)
Monocytes Absolute: 0.4 K/uL (ref 0.1–1.0)
Monocytes Relative: 8.8 % (ref 3.0–12.0)
Neutro Abs: 3 K/uL (ref 1.4–7.7)
Neutrophils Relative %: 63.3 % (ref 43.0–77.0)
Platelets: 185 K/uL (ref 150.0–400.0)
RBC: 4.5 Mil/uL (ref 4.22–5.81)
RDW: 12.7 % (ref 11.5–15.5)
WBC: 4.8 K/uL (ref 4.0–10.5)

## 2020-04-17 LAB — LIPID PANEL
Cholesterol: 138 mg/dL (ref 0–200)
HDL: 43.4 mg/dL (ref >39.00)
LDL Cholesterol: 62 mg/dL (ref 0–99)
NonHDL: 94.62
Total CHOL/HDL Ratio: 3
Triglycerides: 162 mg/dL — ABNORMAL HIGH (ref 0.0–149.0)
VLDL: 32.4 mg/dL (ref 0.0–40.0)

## 2020-04-17 LAB — COMPREHENSIVE METABOLIC PANEL
ALT: 41 U/L (ref 0–53)
AST: 18 U/L (ref 0–37)
Albumin: 4.6 g/dL (ref 3.5–5.2)
Alkaline Phosphatase: 60 U/L (ref 39–117)
BUN: 13 mg/dL (ref 6–23)
CO2: 27 mEq/L (ref 19–32)
Calcium: 10.1 mg/dL (ref 8.4–10.5)
Chloride: 104 mEq/L (ref 96–112)
Creatinine, Ser: 0.88 mg/dL (ref 0.40–1.50)
GFR: 97.98 mL/min (ref >60.00)
Glucose, Bld: 120 mg/dL — ABNORMAL HIGH (ref 70–99)
Potassium: 4.1 mEq/L (ref 3.5–5.1)
Sodium: 137 mEq/L (ref 135–145)
Total Bilirubin: 0.8 mg/dL (ref 0.2–1.2)
Total Protein: 6.8 g/dL (ref 6.0–8.3)

## 2020-04-17 LAB — TSH: TSH: 2.02 u[IU]/mL (ref 0.35–4.50)

## 2020-04-17 LAB — PSA: PSA: 1.19 ng/mL (ref 0.10–4.00)

## 2020-04-17 LAB — HEMOGLOBIN A1C: Hgb A1c MFr Bld: 5.4 % (ref 4.6–6.5)

## 2020-04-17 MED ORDER — ATORVASTATIN CALCIUM 20 MG PO TABS: 20.0000 mg | ORAL_TABLET | Freq: Every day | ORAL | 3 refills | Status: DC

## 2020-04-17 NOTE — Progress Notes (Signed)
Office Note 04/17/2020  CC:  Chief Complaint  Patient presents with  . Annual Exam    Pt is fasting    HPI:  Brett Swanson is a 55 y.o. White male who is here for annual health maintenance exam and f/u HTN and HLD. A/P as of last visit approx 1 yr ago: "HTN; well controlled on lisinopril 20 mg qd.   Continue this dosing.  90 d supply w/RF x 3 eRx'd today. Discussed ongoing TLC as well."  INTERIM HX: Feeling well.  HTN: no home monitoring lately. Taking lisinopril faithfully.  HLD: lipid panel excellent 07/2019 after having been on atorva 20mg  qd for several months.  Crusty brown/black bump on R shin noted several months ago, he scratched at it and it bled, then scabbed, now still there.  No itching or pain.  Past Medical History:  Diagnosis Date  . Alcohol abuse    1 bottle of wine per day; began cutting back 2012  . Depression   . Family history of prostate cancer in father   . GERD (gastroesophageal reflux disease)   . Heart murmur, systolic    2 D ECHO normal 09/2010  . History of adenomatous polyp of colon 04/2018   Recall 3-5 yrs  . Hyperlipidemia    Statin initiated 03/2019  . Hypertension     Past Surgical History:  Procedure Laterality Date  . ADENOIDECTOMY  1973  . COLONOSCOPY W/ POLYPECTOMY  05/02/2018   Adenomatous polyps (multiple). Recall 3-5 yrs  . EYE MUSCLE SURGERY  2005  . hernia 1972    . TONSILLECTOMY  1973  . TRANSTHORACIC ECHOCARDIOGRAM  10/03/2010   Normal except grd I DD.    Family History  Problem Relation Age of Onset  . Alcohol abuse Mother   . Cancer Mother 14       Breast, throat, lung  . Esophageal cancer Mother   . Alcohol abuse Father   . Heart disease Father        enlarged heart  . COPD Father   . Hypertension Father   . Cancer Father 43       prostate  . Rectal cancer Neg Hx   . Stomach cancer Neg Hx   . Colon cancer Neg Hx     Social History   Socioeconomic History  . Marital status: Married     Spouse name: Joycelyn Schmid  . Number of children: 1  . Years of education: 4  . Highest education level: Not on file  Occupational History  . Occupation: Scientist, clinical (histocompatibility and immunogenetics): [employer]    Comment: Sr. Psychologist, sport and exercise  Tobacco Use  . Smoking status: Light Tobacco Smoker    Years: 4.00    Types: Cigarettes  . Smokeless tobacco: Never Used  . Tobacco comment: <1 pk per week  Substance and Sexual Activity  . Alcohol use: Not Currently    Comment: Quit 10/16/17  . Drug use: No  . Sexual activity: Yes  Other Topics Concern  . Not on file  Social History Narrative   Married, one daughter.  Psychologist, sport and exercise for [employer].  Exercise: fast walking.   Nonsmoker.  Alcohol: bottle of wine per day at one point but cut way back.  No drug abuse.   Social Determinants of Health   Financial Resource Strain: Not on file  Food Insecurity: Not on file  Transportation Needs: Not on file  Physical Activity: Not on file  Stress: Not on file  Social Connections: Not  on file  Intimate Partner Violence: Not on file    Outpatient Medications Prior to Visit  Medication Sig Dispense Refill  . Calcium Carbonate Antacid (TUMS PO) Take by mouth as needed.    Marland Kitchen lisinopril (ZESTRIL) 20 MG tablet Take 1 tablet (20 mg total) by mouth daily. 90 tablet 3  . atorvastatin (LIPITOR) 20 MG tablet TAKE ONE TABLET BY MOUTH EVERY DAY 90 tablet 3   No facility-administered medications prior to visit.    No Known Allergies  ROS Review of Systems  Constitutional: Negative for appetite change, chills, fatigue and fever.  HENT: Negative for congestion, dental problem, ear pain and sore throat.   Eyes: Negative for discharge, redness and visual disturbance.  Respiratory: Negative for cough, chest tightness, shortness of breath and wheezing.   Cardiovascular: Negative for chest pain, palpitations and leg swelling.  Gastrointestinal: Negative for abdominal pain, blood in stool, diarrhea, nausea and vomiting.   Genitourinary: Negative for difficulty urinating, dysuria, flank pain, frequency, hematuria and urgency.  Musculoskeletal: Negative for arthralgias, back pain, joint swelling, myalgias and neck stiffness.  Skin: Negative for pallor and rash.  Neurological: Negative for dizziness, speech difficulty, weakness and headaches.  Hematological: Negative for adenopathy. Does not bruise/bleed easily.  Psychiatric/Behavioral: Negative for confusion and sleep disturbance. The patient is not nervous/anxious.     PE; Vitals with BMI 04/17/2020 05/15/2019 05/01/2019  Height 6\' 0"  6' 0.5" -  Weight 215 lbs 13 oz 212 lbs 10 oz -  BMI 91.47 82.95 -  Systolic 621 308 657  Diastolic 73 82 78  Pulse 52 49 -     Gen: Alert, well appearing.  Patient is oriented to person, place, time, and situation. AFFECT: pleasant, lucid thought and speech. ENT: Ears: EACs clear, normal epithelium.  TMs with good light reflex and landmarks bilaterally.  Eyes: no injection, icteris, swelling, or exudate.  EOMI, PERRLA. Nose: no drainage or turbinate edema/swelling.  No injection or focal lesion.  Mouth: lips without lesion/swelling.  Oral mucosa pink and moist.  Dentition intact and without obvious caries or gingival swelling.  Oropharynx without erythema, exudate, or swelling.  Neck: supple/nontender.  No LAD, mass, or TM.  Carotid pulses 2+ bilaterally, without bruits. CV: RRR, no m/r/g.   LUNGS: CTA bilat, nonlabored resps, good aeration in all lung fields. ABD: soft, NT, ND, BS normal.  No hepatospenomegaly or mass.  No bruits. EXT: no clubbing, cyanosis, or edema.  Musculoskeletal: no joint swelling, erythema, warmth, or tenderness.  ROM of all joints intact. Skin - no sores or rashes or color changes. Medial aspect of R shin->3-4 mm hyperpigmented crusty papule   Pertinent labs:  Lab Results  Component Value Date   TSH 2.10 04/17/2019   Lab Results  Component Value Date   WBC 4.3 04/17/2019   HGB 15.5  04/17/2019   HCT 44.5 04/17/2019   MCV 92.7 04/17/2019   PLT 189.0 04/17/2019   Lab Results  Component Value Date   CREATININE 0.90 04/24/2019   BUN 17 04/24/2019   NA 138 04/24/2019   K 4.4 04/24/2019   CL 104 04/24/2019   CO2 26 04/24/2019   Lab Results  Component Value Date   ALT 37 04/17/2019   AST 16 04/17/2019   ALKPHOS 56 04/17/2019   BILITOT 0.9 04/17/2019   Lab Results  Component Value Date   CHOL 122 08/18/2019   Lab Results  Component Value Date   HDL 47.20 08/18/2019   Lab Results  Component Value Date  LDLCALC 55 08/18/2019   Lab Results  Component Value Date   TRIG 99.0 08/18/2019   Lab Results  Component Value Date   CHOLHDL 3 08/18/2019   Lab Results  Component Value Date   PSA 1.79 04/17/2019   PSA 1.07 04/14/2018   PSA 0.97 08/24/2011   Lab Results  Component Value Date   HGBA1C 5.4 04/17/2019   ASSESSMENT AND PLAN:   1) HTN: good control.  Cont lisin 20mg  qd. Lytes/cr today.  2) HLD: tolerating statin.  Cont atorva 20 qd. FLP and hepatic panel today.  3) Skin lesion R LL: does not appear worrisome for malignancy but if not spont resolved in 3 mo then he is to return and I'll do shave excision and send to path.  4) Health maintenance exam: Reviewed age and gender appropriate health maintenance issues (prudent diet, regular exercise, health risks of tobacco and excessive alcohol, use of seatbelts, fire alarms in home, use of sunscreen).  Also reviewed age and gender appropriate health screening as well as vaccine recommendations. Vaccines: flu->given today.  covid 19->UTD. Labs: fasting HP, PSA, a1c (IFG). Prostate ca screening: PSA. Colon ca screening: hx adenomatous polyps, recall 2023-25 time-frame.  An After Visit Summary was printed and given to the patient.  FOLLOW UP:  Return in about 6 months (around 10/15/2020) for routine chronic illness f/u.  Signed:  Crissie Sickles, MD           04/17/2020

## 2020-04-17 NOTE — Patient Instructions (Signed)

## 2020-06-24 ENCOUNTER — Emergency Department (HOSPITAL_BASED_OUTPATIENT_CLINIC_OR_DEPARTMENT_OTHER)
Admission: EM | Admit: 2020-06-24 | Discharge: 2020-06-25 | Disposition: A | Discharge status: Home / Self Care | Payer: BC Managed Care – PPO | Attending: Student | Admitting: Student

## 2020-06-24 ENCOUNTER — Emergency Department (HOSPITAL_BASED_OUTPATIENT_CLINIC_OR_DEPARTMENT_OTHER): Payer: BC Managed Care – PPO

## 2020-06-24 ENCOUNTER — Encounter (HOSPITAL_BASED_OUTPATIENT_CLINIC_OR_DEPARTMENT_OTHER): Payer: Self-pay

## 2020-06-24 ENCOUNTER — Other Ambulatory Visit: Payer: Self-pay

## 2020-06-24 DIAGNOSIS — W268XXA Contact with other sharp object(s), not elsewhere classified, initial encounter: Secondary | ICD-10-CM | POA: Diagnosis not present

## 2020-06-24 DIAGNOSIS — Z5321 Procedure and treatment not carried out due to patient leaving prior to being seen by health care provider: Secondary | ICD-10-CM | POA: Diagnosis not present

## 2020-06-24 DIAGNOSIS — S61211A Laceration without foreign body of left index finger without damage to nail, initial encounter: Secondary | ICD-10-CM | POA: Insufficient documentation

## 2020-06-24 NOTE — ED Triage Notes (Signed)
Pt presents with laceration to R pointer finger after cutting chicken. States his tetanus is UTD.

## 2020-06-24 NOTE — ED Triage Notes (Signed)
Emergency Medicine Provider Triage Evaluation Note  Brett Swanson , a 55 y.o. male  was evaluated in triage.  Pt complains of finger lac to left index finger. Injured while cutting chicken prior to arrival. Last tetanus shot 04/14/2018. Patient is right hand dominant.    Review of Systems  Positive: wound Negative: fever  Physical Exam  There were no vitals taken for this visit. Gen:   Awake, no distress   HEENT:  Atraumatic  Resp:  Normal effort  Cardiac:  Normal rate  Abd:   Nondistended, nontender  MSK:   Moves extremities without difficulty. Superficial laceration to tip of left index finger which involves nailbed.  Neuro:  Speech clear  Medical Decision Making  Medically screening exam initiated at 8:35 PM.  Appropriate orders placed.  EH SESAY was informed that the remainder of the evaluation will be completed by another provider, this initial triage assessment does not replace that evaluation, and the importance of remaining in the ED until their evaluation is complete.  Clinical Impression  X-ray ordered to rule out open fracture   Suzy Bouchard, PA-C 06/24/20 2118

## 2020-09-16 ENCOUNTER — Other Ambulatory Visit: Payer: Self-pay | Admitting: Family Medicine

## 2020-10-10 ENCOUNTER — Ambulatory Visit: Payer: BC Managed Care – PPO | Admitting: Family Medicine

## 2020-10-10 ENCOUNTER — Encounter: Payer: Self-pay | Admitting: Family Medicine

## 2020-10-10 ENCOUNTER — Other Ambulatory Visit: Payer: Self-pay

## 2020-10-10 VITALS — BP 116/73 | HR 56 | Temp 98.1°F | Ht 72.0 in | Wt 217.0 lb

## 2020-10-10 DIAGNOSIS — I10 Essential (primary) hypertension: Secondary | ICD-10-CM | POA: Diagnosis not present

## 2020-10-10 DIAGNOSIS — L989 Disorder of the skin and subcutaneous tissue, unspecified: Secondary | ICD-10-CM | POA: Diagnosis not present

## 2020-10-10 DIAGNOSIS — E7800 Pure hypercholesterolemia, unspecified: Secondary | ICD-10-CM

## 2020-10-10 LAB — COMPREHENSIVE METABOLIC PANEL
ALT: 53 U/L (ref 0–53)
AST: 20 U/L (ref 0–37)
Albumin: 4.7 g/dL (ref 3.5–5.2)
Alkaline Phosphatase: 63 U/L (ref 39–117)
BUN: 23 mg/dL (ref 6–23)
CO2: 25 mEq/L (ref 19–32)
Calcium: 9.7 mg/dL (ref 8.4–10.5)
Chloride: 102 mEq/L (ref 96–112)
Creatinine, Ser: 1.02 mg/dL (ref 0.40–1.50)
GFR: 83.47 mL/min (ref >60.00)
Glucose, Bld: 125 mg/dL — ABNORMAL HIGH (ref 70–99)
Potassium: 4.3 mEq/L (ref 3.5–5.1)
Sodium: 136 mEq/L (ref 135–145)
Total Bilirubin: 1 mg/dL (ref 0.2–1.2)
Total Protein: 7 g/dL (ref 6.0–8.3)

## 2020-10-10 NOTE — Progress Notes (Signed)
OFFICE VISIT  10/10/2020  CC:  Chief Complaint  Patient presents with   Hypertension    RCI; pt is fasting   HPI:    Patient is a 55 y.o. Caucasian male who presents for 6 mo f/u HTN and HLD. A/P as of last visit: "1) HTN: good control.  Cont lisin '20mg'$  qd. Lytes/cr today.   2) HLD: tolerating statin.  Cont atorva 20 qd. FLP and hepatic panel today.   3) Skin lesion R LL: does not appear worrisome for malignancy but if not spont resolved in 3 mo then he is to return and I'll do shave excision and send to path.   4) Health maintenance exam: Reviewed age and gender appropriate health maintenance issues (prudent diet, regular exercise, health risks of tobacco and excessive alcohol, use of seatbelts, fire alarms in home, use of sunscreen).  Also reviewed age and gender appropriate health screening as well as vaccine recommendations. Vaccines: flu->given today.  covid 19->UTD. Labs: fasting HP, PSA, a1c (IFG). Prostate ca screening: PSA. Colon ca screening: hx adenomatous polyps, recall 2023-25 time-frame."  INTERIM HX: All labs normal last visit. Feeling well.  R LL skin lesion unchanged.  HTN: lisinopril '20mg'$  qd.  ONe home check since last visit ->120/80.  HLD: tolerating atorvastatin '20mg'$  qd. Eating more fish, better food choices.  Still some fast food burgers at times. Trying to get motivated to lose wt.  ROS as above, plus--> no fevers, no CP, no SOB, no wheezing, no cough, no dizziness, no HAs, no rashes, no melena/hematochezia.  No polyuria or polydipsia.  No myalgias or arthralgias.  No focal weakness, paresthesias, or tremors.  No acute vision or hearing abnormalities.  No dysuria or unusual/new urinary urgency or frequency.  No recent changes in lower legs. No n/v/d or abd pain.  No palpitations.     Past Medical History:  Diagnosis Date   Depression    Family history of prostate cancer in father    GERD (gastroesophageal reflux disease)    Heart murmur, systolic     2 D ECHO normal 09/2010   History of adenomatous polyp of colon 04/2018   Recall 3-5 yrs   History of alcohol abuse    1 bottle of wine per day; began cutting back 2012   Hyperlipidemia    Statin initiated 03/2019   Hypertension    IFG (impaired fasting glucose)    a1cs have been normal    Past Surgical History:  Procedure Laterality Date   ADENOIDECTOMY  1973   COLONOSCOPY W/ POLYPECTOMY  05/02/2018   Adenomatous polyps (multiple). Recall 3-5 yrs   EYE MUSCLE SURGERY  2005   hernia 1972     TONSILLECTOMY  1973   TRANSTHORACIC ECHOCARDIOGRAM  10/03/2010   Normal except grd I DD.    Outpatient Medications Prior to Visit  Medication Sig Dispense Refill   atorvastatin (LIPITOR) 20 MG tablet Take 1 tablet (20 mg total) by mouth daily. 90 tablet 3   Calcium Carbonate Antacid (TUMS PO) Take by mouth as needed.     lisinopril (ZESTRIL) 20 MG tablet TAKE ONE TABLET BY MOUTH EVERY DAY 90 tablet 1   No facility-administered medications prior to visit.    No Known Allergies  ROS As per HPI  PE: Vitals with BMI 10/10/2020 06/24/2020 04/17/2020  Height '6\' 0"'$  '6\' 0"'$  '6\' 0"'$   Weight 217 lbs 210 lbs 215 lbs 13 oz  BMI 29.42 [id number]  Systolic [id number]  Diastolic 73 99  73  Pulse 56 57 52  Gen: Alert, well appearing.  Patient is oriented to person, place, time, and situation. AFFECT: pleasant, lucid thought and speech. SKIN: Anterior aspect of R LL with 3 mm round scaly nodular lesion with central ulceration.  Dark brown pigment throughout lesion.  LABS:  Lab Results  Component Value Date   TSH 2.02 04/17/2020   Lab Results  Component Value Date   WBC 4.8 04/17/2020   HGB 15.0 04/17/2020   HCT 42.3 04/17/2020   MCV 94.0 04/17/2020   PLT 185.0 04/17/2020   Lab Results  Component Value Date   CREATININE 0.88 04/17/2020   BUN 13 04/17/2020   NA 137 04/17/2020   K 4.1 04/17/2020   CL 104 04/17/2020   CO2 27 04/17/2020   Lab Results  Component Value Date   ALT 41  04/17/2020   AST 18 04/17/2020   ALKPHOS 60 04/17/2020   BILITOT 0.8 04/17/2020   Lab Results  Component Value Date   CHOL 138 04/17/2020   Lab Results  Component Value Date   HDL 43.40 04/17/2020   Lab Results  Component Value Date   LDLCALC 62 04/17/2020   Lab Results  Component Value Date   TRIG 162.0 (H) 04/17/2020   Lab Results  Component Value Date   CHOLHDL 3 04/17/2020   Lab Results  Component Value Date   PSA 1.19 04/17/2020   PSA 1.79 04/17/2019   PSA 1.07 04/14/2018   Lab Results  Component Value Date   HGBA1C 5.4 04/17/2020    IMPRESSION AND PLAN:  1) Pigmented skin lesion, R LL. Excision excised in total with dermablade after consent obtained.  1 cc of 1% lidocaine + epi was used for local anesthesia.  Pt tolerated procedure well.  No immediate complications. Specimen sent to derm path.  2) HTN: well controlled on lisin 20 qd. Lytes/cr today.  3) HLD: tolerating atorva 20 qd long term. LDL was 62 six mo ago. Hepatic panel today. Rpt lipid panel 53mo  An After Visit Summary was printed and given to the patient.  FOLLOW UP: Return in about 6 months (around 04/12/2021) for annual CPE (fasting).  Signed:  PCrissie Sickles MD           10/10/2020

## 2020-10-11 ENCOUNTER — Encounter: Payer: Self-pay | Admitting: Family Medicine

## 2020-10-15 ENCOUNTER — Ambulatory Visit: Payer: BC Managed Care – PPO | Admitting: Family Medicine

## 2020-10-20 ENCOUNTER — Encounter: Payer: Self-pay | Admitting: Family Medicine

## 2020-11-22 ENCOUNTER — Encounter: Payer: Self-pay | Admitting: Family Medicine

## 2020-11-22 NOTE — Telephone Encounter (Signed)
Pls send pic via mychart. If can't do this then I'll need to see it in office before I can know what to tell him for sure.

## 2020-11-22 NOTE — Telephone Encounter (Signed)
Reassure. Does not look infected. Try to avoid any friction over the area, do not pick scab off. Apply a tiny dab of neosporin ointment over the area and cover with a large band aid and only change the bandaid every 24h. I can always take a look at it in the office if he prefers.

## 2020-11-22 NOTE — Telephone Encounter (Signed)
Please see attachment below. 

## 2021-03-18 ENCOUNTER — Other Ambulatory Visit: Payer: Self-pay | Admitting: Family Medicine

## 2021-04-18 ENCOUNTER — Other Ambulatory Visit: Payer: Self-pay

## 2021-04-21 ENCOUNTER — Other Ambulatory Visit: Payer: Self-pay

## 2021-04-21 ENCOUNTER — Other Ambulatory Visit: Payer: Self-pay | Admitting: Family Medicine

## 2021-04-21 ENCOUNTER — Ambulatory Visit (INDEPENDENT_AMBULATORY_CARE_PROVIDER_SITE_OTHER): Payer: BC Managed Care – PPO | Admitting: Family Medicine

## 2021-04-21 ENCOUNTER — Encounter: Payer: Self-pay | Admitting: Family Medicine

## 2021-04-21 VITALS — BP 120/82 | HR 46 | Temp 97.6°F | Ht 72.5 in | Wt 213.6 lb

## 2021-04-21 DIAGNOSIS — R7301 Impaired fasting glucose: Secondary | ICD-10-CM

## 2021-04-21 DIAGNOSIS — I10 Essential (primary) hypertension: Secondary | ICD-10-CM

## 2021-04-21 DIAGNOSIS — E7800 Pure hypercholesterolemia, unspecified: Secondary | ICD-10-CM

## 2021-04-21 DIAGNOSIS — Z0000 Encounter for general adult medical examination without abnormal findings: Secondary | ICD-10-CM

## 2021-04-21 DIAGNOSIS — L989 Disorder of the skin and subcutaneous tissue, unspecified: Secondary | ICD-10-CM

## 2021-04-21 DIAGNOSIS — Z125 Encounter for screening for malignant neoplasm of prostate: Secondary | ICD-10-CM | POA: Diagnosis not present

## 2021-04-21 LAB — CBC WITH DIFFERENTIAL/PLATELET
Basophils Absolute: 0 10*3/uL (ref 0.0–0.1)
Basophils Relative: 0.6 % (ref 0.0–3.0)
Eosinophils Absolute: 0 10*3/uL (ref 0.0–0.7)
Eosinophils Relative: 1.2 % (ref 0.0–5.0)
HCT: 42.7 % (ref 39.0–52.0)
Hemoglobin: 14.6 g/dL (ref 13.0–17.0)
Lymphocytes Relative: 30.3 % (ref 12.0–46.0)
Lymphs Abs: 1.1 10*3/uL (ref 0.7–4.0)
MCHC: 34.3 g/dL (ref 30.0–36.0)
MCV: 93.4 fl (ref 78.0–100.0)
Monocytes Absolute: 0.3 10*3/uL (ref 0.1–1.0)
Monocytes Relative: 7.5 % (ref 3.0–12.0)
Neutro Abs: 2.1 10*3/uL (ref 1.4–7.7)
Neutrophils Relative %: 60.4 % (ref 43.0–77.0)
Platelets: 200 10*3/uL (ref 150.0–400.0)
RBC: 4.57 Mil/uL (ref 4.22–5.81)
RDW: 12.6 % (ref 11.5–15.5)
WBC: 3.5 10*3/uL — ABNORMAL LOW (ref 4.0–10.5)

## 2021-04-21 LAB — LIPID PANEL
Cholesterol: 104 mg/dL (ref 0–200)
HDL: 40 mg/dL (ref >39.00)
LDL Cholesterol: 45 mg/dL (ref 0–99)
NonHDL: 64.28
Total CHOL/HDL Ratio: 3
Triglycerides: 98 mg/dL (ref 0.0–149.0)
VLDL: 19.6 mg/dL (ref 0.0–40.0)

## 2021-04-21 LAB — COMPREHENSIVE METABOLIC PANEL
ALT: 34 U/L (ref 0–53)
AST: 19 U/L (ref 0–37)
Albumin: 4.7 g/dL (ref 3.5–5.2)
Alkaline Phosphatase: 51 U/L (ref 39–117)
BUN: 12 mg/dL (ref 6–23)
CO2: 27 mEq/L (ref 19–32)
Calcium: 10 mg/dL (ref 8.4–10.5)
Chloride: 103 mEq/L (ref 96–112)
Creatinine, Ser: 0.99 mg/dL (ref 0.40–1.50)
GFR: 86.19 mL/min (ref >60.00)
Glucose, Bld: 90 mg/dL (ref 70–99)
Potassium: 4.2 mEq/L (ref 3.5–5.1)
Sodium: 138 mEq/L (ref 135–145)
Total Bilirubin: 1.3 mg/dL — ABNORMAL HIGH (ref 0.2–1.2)
Total Protein: 7 g/dL (ref 6.0–8.3)

## 2021-04-21 LAB — HEMOGLOBIN A1C: Hgb A1c MFr Bld: 5.5 % (ref 4.6–6.5)

## 2021-04-21 LAB — TSH: TSH: 1.44 u[IU]/mL (ref 0.35–5.50)

## 2021-04-21 LAB — PSA: PSA: 1.63 ng/mL (ref 0.10–4.00)

## 2021-04-21 MED ORDER — LISINOPRIL 20 MG PO TABS: 20.0000 mg | ORAL_TABLET | Freq: Every day | ORAL | 3 refills | Status: DC

## 2021-04-21 NOTE — Progress Notes (Signed)
Office Note 04/21/2021  CC:  Chief Complaint  Patient presents with   Annual Exam    Fasting,     HPI:  Patient is a 56 y.o. male who is here for annual health maintenance exam and f/u HTN and HLD.  Is a last saw him he has been doing good.  In the last month he has started dieting and exercising again.  Is down 12 pounds in 4 weeks.  His goal weight is 184 pounds.  Occasional home blood pressure check is always less than 130/80.  Says right lower leg skin lesion has grown back--I did a shave excision of a lesion on this spot in the summer 2022 and path showed angiokeratoma.   Past Medical History:  Diagnosis Date   Depression    Family history of prostate cancer in father    GERD (gastroesophageal reflux disease)    Heart murmur, systolic    2 D ECHO normal 09/2010   History of adenomatous polyp of colon 04/2018   Recall 3-5 yrs   History of alcohol abuse    1 bottle of wine per day; began cutting back 2012   Hyperlipidemia    Statin initiated 03/2019   Hypertension    IFG (impaired fasting glucose)    a1cs have been normal, most recently 03/2020    Past Surgical History:  Procedure Laterality Date   ADENOIDECTOMY  1973   COLONOSCOPY W/ POLYPECTOMY  05/02/2018   Adenomatous polyps (multiple). Recall 3-5 yrs   EYE MUSCLE SURGERY  2005   hernia 1972     TONSILLECTOMY  1973   TRANSTHORACIC ECHOCARDIOGRAM  10/03/2010   Normal except grd I DD.    Family History  Problem Relation Age of Onset   Alcohol abuse Mother    Cancer Mother 75       Breast, throat, lung   Esophageal cancer Mother    Alcohol abuse Father    Heart disease Father        enlarged heart   COPD Father    Hypertension Father    Cancer Father 96       prostate   Rectal cancer Neg Hx    Stomach cancer Neg Hx    Colon cancer Neg Hx     Social History   Socioeconomic History   Marital status: Married    Spouse name: Joycelyn Schmid   Number of children: 1   Years of education: 4   Highest  education level: Not on file  Occupational History   Occupation: Scientist, clinical (histocompatibility and immunogenetics): [employer]    Comment: Sr. Psychologist, sport and exercise  Tobacco Use   Smoking status: Light Smoker    Years: 4.00    Types: Cigarettes   Smokeless tobacco: Never   Tobacco comments:    <1 pk per week  Substance and Sexual Activity   Alcohol use: Yes   Drug use: No   Sexual activity: Yes  Other Topics Concern   Not on file  Social History Narrative   Married, one daughter.  Psychologist, sport and exercise for [employer].  Exercise: fast walking.   Nonsmoker.  Alcohol: bottle of wine per day at one point but cut way back.  No drug abuse.   Social Determinants of Health   Financial Resource Strain: Not on file  Food Insecurity: Not on file  Transportation Needs: Not on file  Physical Activity: Not on file  Stress: Not on file  Social Connections: Not on file  Intimate Partner Violence: Not on  file    Outpatient Medications Prior to Visit  Medication Sig Dispense Refill   atorvastatin (LIPITOR) 20 MG tablet Take 1 tablet (20 mg total) by mouth daily. 90 tablet 3   Calcium Carbonate Antacid (TUMS PO) Take by mouth as needed.     lisinopril (ZESTRIL) 20 MG tablet TAKE ONE TABLET BY MOUTH EVERY DAY 30 tablet 0   No facility-administered medications prior to visit.    No Known Allergies  ROS Review of Systems  Constitutional:  Negative for appetite change, chills, fatigue and fever.  HENT:  Negative for congestion, dental problem, ear pain and sore throat.   Eyes:  Negative for discharge, redness and visual disturbance.  Respiratory:  Negative for cough, chest tightness, shortness of breath and wheezing.   Cardiovascular:  Negative for chest pain, palpitations and leg swelling.  Gastrointestinal:  Negative for abdominal pain, blood in stool, diarrhea, nausea and vomiting.  Genitourinary:  Negative for difficulty urinating, dysuria, flank pain, frequency, hematuria and urgency.  Musculoskeletal:  Negative for  arthralgias, back pain, joint swelling, myalgias and neck stiffness.  Skin:  Negative for pallor and rash.       RLL round, coarse lesion at site of past shave excision  Neurological:  Negative for dizziness, speech difficulty, weakness and headaches.  Hematological:  Negative for adenopathy. Does not bruise/bleed easily.  Psychiatric/Behavioral:  Negative for confusion and sleep disturbance. The patient is not nervous/anxious.    PE; Vitals with BMI 04/21/2021 10/10/2020 06/24/2020  Height 6' 1.25" 6\' 0"  6\' 0"   Weight 236 lbs 217 lbs 210 lbs  BMI 30.91 15.17 61.60  Systolic 737 106 269  Diastolic 84 73 99  Pulse 46 56 57   Gen: Alert, well appearing.  Patient is oriented to person, place, time, and situation. AFFECT: pleasant, lucid thought and speech. ENT: Ears: EACs clear, normal epithelium.  TMs with good light reflex and landmarks bilaterally.  Eyes: no injection, icteris, swelling, or exudate.  EOMI, PERRLA. Nose: no drainage or turbinate edema/swelling.  No injection or focal lesion.  Mouth: lips without lesion/swelling.  Oral mucosa pink and moist.  Dentition intact and without obvious caries or gingival swelling.  Oropharynx without erythema, exudate, or swelling.  Neck: supple/nontender.  No LAD, mass, or TM.  Carotid pulses 2+ bilaterally, without bruits. CV: RRR, no m/r/g.   LUNGS: CTA bilat, nonlabored resps, good aeration in all lung fields. ABD: soft, NT, ND, BS normal.  No hepatospenomegaly or mass.  No bruits. EXT: no clubbing, cyanosis, or edema.  Musculoskeletal: no joint swelling, erythema, warmth, or tenderness.  ROM of all joints intact. Skin -right leg pretibial surface with 1 cm oval hyperkeratotic/crusty papule, dark color.  Otherwise, no sores or suspicious lesions or rashes or color changes.   Pertinent labs:  Lab Results  Component Value Date   TSH 2.02 04/17/2020   Lab Results  Component Value Date   WBC 4.8 04/17/2020   HGB 15.0 04/17/2020   HCT 42.3  04/17/2020   MCV 94.0 04/17/2020   PLT 185.0 04/17/2020   Lab Results  Component Value Date   CREATININE 1.02 10/10/2020   BUN 23 10/10/2020   NA 136 10/10/2020   K 4.3 10/10/2020   CL 102 10/10/2020   CO2 25 10/10/2020   Lab Results  Component Value Date   ALT 53 10/10/2020   AST 20 10/10/2020   ALKPHOS 63 10/10/2020   BILITOT 1.0 10/10/2020   Lab Results  Component Value Date   CHOL 138  04/17/2020   Lab Results  Component Value Date   HDL 43.40 04/17/2020   Lab Results  Component Value Date   LDLCALC 62 04/17/2020   Lab Results  Component Value Date   TRIG 162.0 (H) 04/17/2020   Lab Results  Component Value Date   CHOLHDL 3 04/17/2020   Lab Results  Component Value Date   PSA 1.19 04/17/2020   PSA 1.79 04/17/2019   PSA 1.07 04/14/2018   Lab Results  Component Value Date   HGBA1C 5.4 04/17/2020   ASSESSMENT AND PLAN:   #1 hypertension, well controlled. Continue lisinopril 20 mg a day. Electrolytes and creatinine today.  2.  Hyperlipidemia.  Doing well on atorvastatin 20 mg a day.  Goal LDL less than 100.  Last LDL about 1 year ago was 62. Lipid panel and hepatic panel today.  3.  Left lower leg skin lesion.  Hx R LL angiokeratoma.  I did shave excision 09/2020 but a lesion has grown back at same site.  Pt desires referral to dermatologist--ordered today.  4. Health maintenance exam: Reviewed age and gender appropriate health maintenance issues (prudent diet, regular exercise, health risks of tobacco and excessive alcohol, use of seatbelts, fire alarms in home, use of sunscreen).  Also reviewed age and gender appropriate health screening as well as vaccine recommendations. Vaccines: Flu->given today.  O/w UTD. Labs: fasting HP, Hba1c (IFG), PSA. Prostate ca screening: PSA today. Colon ca screening: hx polyps, due for 3-5 yr recall 04/2021-04/2023.  An After Visit Summary was printed and given to the patient.  FOLLOW UP:  Return in about 6 months  (around 10/19/2021) for routine chronic illness f/u.  Signed:  Crissie Sickles, MD           04/21/2021

## 2021-04-21 NOTE — Patient Instructions (Signed)

## 2021-05-02 ENCOUNTER — Encounter: Payer: Self-pay | Admitting: Family Medicine

## 2021-05-05 ENCOUNTER — Telehealth: Payer: BC Managed Care – PPO | Admitting: Physician Assistant

## 2021-05-05 DIAGNOSIS — U071 COVID-19: Secondary | ICD-10-CM | POA: Diagnosis not present

## 2021-05-05 MED ORDER — MECLIZINE HCL 25 MG PO TABS: 25.0000 mg | ORAL_TABLET | Freq: Three times a day (TID) | ORAL | 0 refills | Status: DC | PRN

## 2021-05-05 MED ORDER — MOLNUPIRAVIR EUA 200MG CAPSULE: 4.0000 | ORAL_CAPSULE | Freq: Two times a day (BID) | ORAL | 0 refills | Status: AC

## 2021-05-05 NOTE — Progress Notes (Signed)
Virtual Visit Consent   PURL CLAYTOR, you are scheduled for a virtual visit with a [facility] provider today.     Just as with appointments in the office, your consent must be obtained to participate.  Your consent will be active for this visit and any virtual visit you may have with one of our providers in the next 365 days.     If you have a MyChart account, a copy of this consent can be sent to you electronically.  All virtual visits are billed to your insurance company just like a traditional visit in the office.    As this is a virtual visit, video technology does not allow for your provider to perform a traditional examination.  This may limit your provider's ability to fully assess your condition.  If your provider identifies any concerns that need to be evaluated in person or the need to arrange testing (such as labs, EKG, etc.), we will make arrangements to do so.     Although advances in technology are sophisticated, we cannot ensure that it will always work on either your end or our end.  If the connection with a video visit is poor, the visit may have to be switched to a telephone visit.  With either a video or telephone visit, we are not always able to ensure that we have a secure connection.     I need to obtain your verbal consent now.   Are you willing to proceed with your visit today?    Brett Swanson has provided verbal consent on 05/05/2021 for a virtual visit (video or telephone).   Mar Daring, PA-C   Date: 05/05/2021 12:00 PM   Virtual Visit via Video Note   IMar Daring, connected with  Brett Swanson  ([id number], [birth year]) on 05/05/21 at 11:30 AM EST by a video-enabled telemedicine application and verified that I am speaking with the correct person using two identifiers.  Location: Patient: Virtual Visit Location Patient: Home Provider: Virtual Visit Location Provider: Home Office   I discussed the limitations of evaluation and  management by telemedicine and the availability of in person appointments. The patient expressed understanding and agreed to proceed.    History of Present Illness: Brett Swanson is a 56 y.o. who identifies as a male who was assigned male at birth, and is being seen today for Covid 55.  HPI: URI  This is a new problem. The current episode started in the past 7 days (Symptoms started Friday; tested positive for Covid 19 Friday as well). The problem has been gradually worsening. Maximum temperature: 99.9. The fever has been present for Less than 1 day. Associated symptoms include congestion, coughing (very mild), headaches, rhinorrhea (very mild) and a sore throat (scratchy). Associated symptoms comments: Body aches, fatigue, mild post nasal drainage, dizziness, eye pain and difficulty aligning eyes. Treatments tried: excedrin migraine. The treatment provided no relief.   Vaccinated x 4  Problems:  Patient Active Problem List   Diagnosis Date Noted   Maxillary sinusitis, acute 04/16/2015   Health maintenance examination 09/01/2011   Contact dermatitis 09/01/2011   Heart murmur, systolic 58/52/7782   Alcohol abuse 07/02/2010   HYPERLIPIDEMIA 11/27/2008   DEPRESSION 11/27/2008   HYPERTENSION 11/27/2008   GERD 11/27/2008    Allergies: No Known Allergies Medications:  Current Outpatient Medications:    meclizine (ANTIVERT) 25 MG tablet, Take 1 tablet (25 mg total) by mouth 3 (three) times daily as needed for dizziness.,  Disp: 30 tablet, Rfl: 0   molnupiravir EUA (LAGEVRIO) 200 mg CAPS capsule, Take 4 capsules (800 mg total) by mouth 2 (two) times daily for 5 days., Disp: 40 capsule, Rfl: 0   atorvastatin (LIPITOR) 20 MG tablet, TAKE ONE TABLET BY MOUTH EVERY DAY, Disp: 90 tablet, Rfl: 1   Calcium Carbonate Antacid (TUMS PO), Take by mouth as needed., Disp: , Rfl:    lisinopril (ZESTRIL) 20 MG tablet, Take 1 tablet (20 mg total) by mouth daily., Disp: 90 tablet, Rfl:  3  Observations/Objective: Patient is well-developed, well-nourished in no acute distress.  Resting comfortably at home.  Head is normocephalic, atraumatic.  No labored breathing.  Speech is clear and coherent with logical content.  Patient is alert and oriented at baseline.    Assessment and Plan: 1. COVID-19 - molnupiravir EUA (LAGEVRIO) 200 mg CAPS capsule; Take 4 capsules (800 mg total) by mouth 2 (two) times daily for 5 days.  Dispense: 40 capsule; Refill: 0 - meclizine (ANTIVERT) 25 MG tablet; Take 1 tablet (25 mg total) by mouth 3 (three) times daily as needed for dizziness.  Dispense: 30 tablet; Refill: 0 - MyChart COVID-19 home monitoring program; Future  - Continue OTC symptomatic management of choice - Will send OTC vitamins and supplement information through AVS - Molnupiravir and meclizine prescribed - Patient enrolled in MyChart symptom monitoring - Push fluids - Rest as needed - Discussed return precautions and when to seek in-person evaluation, sent via AVS as well  Follow Up Instructions: I discussed the assessment and treatment plan with the patient. The patient was provided an opportunity to ask questions and all were answered. The patient agreed with the plan and demonstrated an understanding of the instructions.  A copy of instructions were sent to the patient via MyChart unless otherwise noted below.   The patient was advised to call back or seek an in-person evaluation if the symptoms worsen or if the condition fails to improve as anticipated.  Time:  I spent 17 minutes with the patient via telehealth technology discussing the above problems/concerns.    Mar Daring, PA-C

## 2021-05-05 NOTE — Patient Instructions (Signed)
Brett Swanson, thank you for joining Mar Daring, PA-C for today's virtual visit.  While this provider is not your primary care provider (PCP), if your PCP is located in our provider database this encounter information will be shared with them immediately following your visit.  Consent: (Patient) Brett Swanson provided verbal consent for this virtual visit at the beginning of the encounter.  Current Medications:  Current Outpatient Medications:    meclizine (ANTIVERT) 25 MG tablet, Take 1 tablet (25 mg total) by mouth 3 (three) times daily as needed for dizziness., Disp: 30 tablet, Rfl: 0   molnupiravir EUA (LAGEVRIO) 200 mg CAPS capsule, Take 4 capsules (800 mg total) by mouth 2 (two) times daily for 5 days., Disp: 40 capsule, Rfl: 0   atorvastatin (LIPITOR) 20 MG tablet, TAKE ONE TABLET BY MOUTH EVERY DAY, Disp: 90 tablet, Rfl: 1   Calcium Carbonate Antacid (TUMS PO), Take by mouth as needed., Disp: , Rfl:    lisinopril (ZESTRIL) 20 MG tablet, Take 1 tablet (20 mg total) by mouth daily., Disp: 90 tablet, Rfl: 3   Medications ordered in this encounter:  Meds ordered this encounter  Medications   molnupiravir EUA (LAGEVRIO) 200 mg CAPS capsule    Sig: Take 4 capsules (800 mg total) by mouth 2 (two) times daily for 5 days.    Dispense:  40 capsule    Refill:  0    Order Specific Question:   Supervising Provider    Answer:   Sabra Heck, BRIAN [[id number]]   meclizine (ANTIVERT) 25 MG tablet    Sig: Take 1 tablet (25 mg total) by mouth 3 (three) times daily as needed for dizziness.    Dispense:  30 tablet    Refill:  0    Order Specific Question:   Supervising Provider    Answer:   Sabra Heck, BRIAN [[id number]]     *If you need refills on other medications prior to your next appointment, please contact your pharmacy*  Follow-Up: Call back or seek an in-person evaluation if the symptoms worsen or if the condition fails to improve as anticipated.  Other Instructions Molnupiravir Oral  Capsules What is this medication? MOLNUPIRAVIR (mol nue pir a vir) treats COVID-19. It is an antiviral medication. It may decrease the risk of developing severe symptoms of COVID-19. It may also decrease the chance of going to the hospital. This medication is not approved by the FDA. The FDA has authorized emergency use of this medication during the COVID-19 pandemic. This medicine may be used for other purposes; ask your health care provider or pharmacist if you have questions. COMMON BRAND NAME(S): LAGEVRIO What should I tell my care team before I take this medication? They need to know if you have any of these conditions: Any allergies Any serious illness An unusual or allergic reaction to molnupiravir, other medications, foods, dyes, or preservatives Pregnant or trying to get pregnant Breast-feeding How should I use this medication? Take this medication by mouth with water. Take it as directed on the prescription label at the same time every day. Do not cut, crush or chew this medication. Swallow the capsules whole. You can take it with or without food. If it upsets your stomach, take it with food. Take all of this medication unless your care team tells you to stop it early. Keep taking it even if you think you are better. Talk to your care team about the use of this medication in children. Special care may be needed. Overdosage:  If you think you have taken too much of this medicine contact a poison control center or emergency room at once. NOTE: This medicine is only for you. Do not share this medicine with others. What if I miss a dose? If you miss a dose, take it as soon as you can unless it is more than 10 hours late. If it is more than 10 hours late, skip the missed dose. Take the next dose at the normal time. Do not take extra or 2 doses at the same time to make up for the missed dose. What may interact with this medication? Interactions have not been studied. This list may not  describe all possible interactions. Give your health care provider a list of all the medicines, herbs, non-prescription drugs, or dietary supplements you use. Also tell them if you smoke, drink alcohol, or use illegal drugs. Some items may interact with your medicine. What should I watch for while using this medication? Your condition will be monitored carefully while you are receiving this medication. Visit your care team for regular checkups. Tell your care team if your symptoms do not start to get better or if they get worse. Do not become pregnant while taking this medication. You may need a pregnancy test before starting this medication. Women must use a reliable form of birth control while taking this medication and for 4 days after stopping the medication. Women should inform their care team if they wish to become pregnant or think they might be pregnant. Men should not father a child while taking this medication and for 3 months after stopping it. There is potential for serious harm to an unborn child. Talk to your care team for more information. Do not breast-feed an infant while taking this medication and for 4 days after stopping the medication. What side effects may I notice from receiving this medication? Side effects that you should report to your care team as soon as possible: Allergic reactions--skin rash, itching, hives, swelling of the face, lips, tongue, or throat Side effects that usually do not require medical attention (report these to your care team if they continue or are bothersome): Diarrhea Dizziness Nausea This list may not describe all possible side effects. Call your doctor for medical advice about side effects. You may report side effects to FDA at 1-800-FDA-1088. Where should I keep my medication? Keep out of the reach of children and pets. Store at room temperature between 20 and 25 degrees C (68 and 77 degrees F). Get rid of any unused medication after the expiration  date. To get rid of medications that are no longer needed or have expired: Take the medication to a medication take-back program. Check with your pharmacy or law enforcement to find a location. If you cannot return the medication, check the label or package insert to see if the medication should be thrown out in the garbage or flushed down the toilet. If you are not sure, ask your care team. If it is safe to put it in the trash, take the medication out of the container. Mix the medication with cat litter, dirt, coffee grounds, or other unwanted substance. Seal the mixture in a bag or container. Put it in the trash. NOTE: This sheet is a summary. It may not cover all possible information. If you have questions about this medicine, talk to your doctor, pharmacist, or health care provider.  2022 Elsevier/Gold Standard (2020-03-18 00:00:00)   10 Things You Can Do to Manage Your COVID-19  Symptoms at Home If you have possible or confirmed COVID-19 Stay home except to get medical care. Monitor your symptoms carefully. If your symptoms get worse, call your healthcare provider immediately. Get rest and stay hydrated. If you have a medical appointment, call the healthcare provider ahead of time and tell them that you have or may have COVID-19. For medical emergencies, call 911 and notify the dispatch personnel that you have or may have COVID-19. Cover your cough and sneezes with a tissue or use the inside of your elbow. Wash your hands often with soap and water for at least 20 seconds or clean your hands with an alcohol-based hand sanitizer that contains at least 60% alcohol. As much as possible, stay in a specific room and away from other people in your home. Also, you should use a separate bathroom, if available. If you need to be around other people in or outside of the home, wear a mask. Avoid sharing personal items with other people in your household, like dishes, towels, and bedding. Clean all  surfaces that are touched often, like counters, tabletops, and doorknobs. Use household cleaning sprays or wipes according to the label instructions. [email or web address] 10/06/2019 This information is not intended to replace advice given to you by your health care provider. Make sure you discuss any questions you have with your health care provider. Document Revised: 11/29/2020 Document Reviewed: 11/29/2020 Elsevier Patient Education  2022 [employer].    If you have been instructed to have an in-person evaluation today at a local Urgent Care facility, please use the link below. It will take you to a list of all of our available [facility], including address, phone number and hours of operation. Please do not delay care.  [facility]  If you or a family member do not have a primary care provider, use the link below to schedule a visit and establish care. When you choose a [facility] primary care physician or advanced practice provider, you gain a long-term partner in health. Find a Primary Care Provider  Learn more about [facility]'s in-office and virtual care options: [facility]

## 2021-07-28 ENCOUNTER — Encounter: Payer: Self-pay | Admitting: Gastroenterology

## 2021-10-07 ENCOUNTER — Other Ambulatory Visit: Payer: Self-pay | Admitting: Family Medicine

## 2021-10-20 ENCOUNTER — Encounter: Payer: Self-pay | Admitting: Family Medicine

## 2021-10-20 ENCOUNTER — Ambulatory Visit: Payer: BC Managed Care – PPO | Admitting: Family Medicine

## 2021-10-20 VITALS — BP 122/76 | HR 44 | Temp 97.5°F | Ht 72.5 in | Wt 198.2 lb

## 2021-10-20 DIAGNOSIS — I10 Essential (primary) hypertension: Secondary | ICD-10-CM | POA: Diagnosis not present

## 2021-10-20 DIAGNOSIS — E7800 Pure hypercholesterolemia, unspecified: Secondary | ICD-10-CM

## 2021-10-20 LAB — LIPID PANEL
Cholesterol: 132 mg/dL (ref 0–200)
HDL: 61.9 mg/dL (ref >39.00)
LDL Cholesterol: 56 mg/dL (ref 0–99)
NonHDL: 70.22
Total CHOL/HDL Ratio: 2
Triglycerides: 69 mg/dL (ref 0.0–149.0)
VLDL: 13.8 mg/dL (ref 0.0–40.0)

## 2021-10-20 LAB — COMPREHENSIVE METABOLIC PANEL
ALT: 36 U/L (ref 0–53)
AST: 17 U/L (ref 0–37)
Albumin: 4.7 g/dL (ref 3.5–5.2)
Alkaline Phosphatase: 51 U/L (ref 39–117)
BUN: 17 mg/dL (ref 6–23)
CO2: 27 mEq/L (ref 19–32)
Calcium: 9.6 mg/dL (ref 8.4–10.5)
Chloride: 104 mEq/L (ref 96–112)
Creatinine, Ser: 0.88 mg/dL (ref 0.40–1.50)
GFR: 96.95 mL/min (ref >60.00)
Glucose, Bld: 99 mg/dL (ref 70–99)
Potassium: 4.9 mEq/L (ref 3.5–5.1)
Sodium: 138 mEq/L (ref 135–145)
Total Bilirubin: 1.1 mg/dL (ref 0.2–1.2)
Total Protein: 6.9 g/dL (ref 6.0–8.3)

## 2021-10-20 MED ORDER — ATORVASTATIN CALCIUM 20 MG PO TABS: 20.0000 mg | ORAL_TABLET | Freq: Every day | ORAL | 1 refills | Status: DC

## 2021-10-20 NOTE — Progress Notes (Signed)
OFFICE VISIT  10/20/2021  CC:  Chief Complaint  Patient presents with   Hyperlipidemia   Hypertension    Pt is fasting    Patient is a 56 y.o. male who presents for 63-monthfollow-up hypertension and hyperlipidemia.  INTERIM HX: CGerald Stabsfeels well. Home blood pressure normal. No problems with lisinopril or atorvastatin. He is maintaining good lifestyle habits, has lost another 15 pounds purposefully since last visit.  ROS --> no fevers, no CP, no SOB, no wheezing, no cough, no dizziness, no HAs, no rashes, no melena/hematochezia.  No polyuria or polydipsia.  No myalgias or arthralgias.  No focal weakness, paresthesias, or tremors.  No acute vision or hearing abnormalities.  No dysuria or unusual/new urinary urgency or frequency.  No recent changes in lower legs. No n/v/d or abd pain.  No palpitations.     Past Medical History:  Diagnosis Date   Depression    Family history of prostate cancer in father    GERD (gastroesophageal reflux disease)    Heart murmur, systolic    2 D ECHO normal 09/2010   History of adenomatous polyp of colon 04/2018   Recall 3-5 yrs   History of alcohol abuse    1 bottle of wine per day; began cutting back 2012   Hyperlipidemia    Statin initiated 03/2019   Hypertension    IFG (impaired fasting glucose)    a1cs have been normal, most recently 03/2020    Past Surgical History:  Procedure Laterality Date   ADENOIDECTOMY  1973   COLONOSCOPY W/ POLYPECTOMY  05/02/2018   Adenomatous polyps (multiple). Recall 3-5 yrs   EYE MUSCLE SURGERY  2005   hernia 1972     TONSILLECTOMY  1973   TRANSTHORACIC ECHOCARDIOGRAM  10/03/2010   Normal except grd I DD.    Outpatient Medications Prior to Visit  Medication Sig Dispense Refill   Calcium Carbonate Antacid (TUMS PO) Take by mouth as needed.     lisinopril (ZESTRIL) 20 MG tablet Take 1 tablet (20 mg total) by mouth daily. 90 tablet 3   meclizine (ANTIVERT) 25 MG tablet Take 1 tablet (25 mg total) by mouth  3 (three) times daily as needed for dizziness. (Patient not taking: Reported on 10/20/2021) 30 tablet 0   atorvastatin (LIPITOR) 20 MG tablet TAKE ONE TABLET BY MOUTH EVERY DAY 90 tablet 1   No facility-administered medications prior to visit.    No Known Allergies  ROS As per HPI  PE:    10/20/2021    8:04 AM 04/21/2021   10:11 AM 04/21/2021    9:32 AM  Vitals with BMI  Height 6' 0.5" 6' 0.5" 6' 1.25"  Weight 198 lbs 3 oz 213 lbs 10 oz 236 lbs  BMI 26.5 [phone] Systolic [id number] Diastolic 76 82 84  Pulse 44  46   Physical Exam  Gen: Alert, well appearing.  Patient is oriented to person, place, time, and situation. AFFECT: pleasant, lucid thought and speech. CV: RRR, no m/r/g.   LUNGS: CTA bilat, nonlabored resps, good aeration in all lung fields. EXT: no clubbing or cyanosis.  no edema.    LABS:  Last CBC Lab Results  Component Value Date   WBC 3.5 (L) 04/21/2021   HGB 14.6 04/21/2021   HCT 42.7 04/21/2021   MCV 93.4 04/21/2021   RDW 12.6 04/21/2021   PLT 200.0 031/49/7026  Last metabolic panel Lab Results  Component Value Date   GLUCOSE 90 04/21/2021  NA 138 04/21/2021   K 4.2 04/21/2021   CL 103 04/21/2021   CO2 27 04/21/2021   BUN 12 04/21/2021   CREATININE 0.99 04/21/2021   CALCIUM 10.0 04/21/2021   PROT 7.0 04/21/2021   ALBUMIN 4.7 04/21/2021   BILITOT 1.3 (H) 04/21/2021   ALKPHOS 51 04/21/2021   AST 19 04/21/2021   ALT 34 04/21/2021   Last lipids Lab Results  Component Value Date   CHOL 104 04/21/2021   HDL 40.00 04/21/2021   LDLCALC 45 04/21/2021   LDLDIRECT 121.7 08/24/2011   TRIG 98.0 04/21/2021   CHOLHDL 3 04/21/2021   Last hemoglobin A1c Lab Results  Component Value Date   HGBA1C 5.5 04/21/2021   Last thyroid functions Lab Results  Component Value Date   TSH 1.44 04/21/2021   Lab Results  Component Value Date   PSA 1.63 04/21/2021   PSA 1.19 04/17/2020   PSA 1.79 04/17/2019   IMPRESSION AND PLAN:  #1  hypertension, good control on lisinopril 20 mg a day. Electrolytes and creatinine today.  2.  Hypercholesterolemia. Doing well on atorvastatin 20 mg a day. Cholesterol panel and a hepatic panel today.  Medication refilled x90 days, refill x1  An After Visit Summary was printed and given to the patient.  FOLLOW UP: Return in about 6 months (around 04/22/2022) for annual CPE (fasting).  Signed:  Crissie Sickles, MD           10/20/2021

## 2021-11-17 ENCOUNTER — Ambulatory Visit: Payer: BC Managed Care – PPO | Admitting: Dermatology

## 2021-11-20 ENCOUNTER — Encounter: Payer: Self-pay | Admitting: Family Medicine

## 2021-11-20 DIAGNOSIS — L989 Disorder of the skin and subcutaneous tissue, unspecified: Secondary | ICD-10-CM

## 2021-11-21 NOTE — Telephone Encounter (Signed)
Please review and advise.

## 2021-11-25 NOTE — Telephone Encounter (Signed)
Okay, I ordered a new referral to the skin surgery center in [facility]

## 2021-12-22 NOTE — Telephone Encounter (Signed)
Referral letter sent on 9/5 confirming patient was referred to: [facility] [address] [place] [address] [phone]  Will have to call their office to confirm referral received

## 2021-12-23 NOTE — Telephone Encounter (Signed)
Spoke with Maudie Mercury at [facility] to confirm if referral received, they do not have anything. She said the referral could be faxed again to [phone], Yolonda Kida.

## 2021-12-24 NOTE — Telephone Encounter (Signed)
Noted. Agree.

## 2022-01-06 NOTE — Telephone Encounter (Signed)
Please advise if any other suggestions.

## 2022-04-14 ENCOUNTER — Other Ambulatory Visit: Payer: Self-pay | Admitting: Family Medicine

## 2022-04-16 ENCOUNTER — Other Ambulatory Visit: Payer: Self-pay | Admitting: Family Medicine

## 2022-04-23 ENCOUNTER — Encounter: Payer: BC Managed Care – PPO | Admitting: Family Medicine

## 2022-04-30 ENCOUNTER — Ambulatory Visit (INDEPENDENT_AMBULATORY_CARE_PROVIDER_SITE_OTHER): Payer: BC Managed Care – PPO | Admitting: Family Medicine

## 2022-04-30 ENCOUNTER — Encounter: Payer: Self-pay | Admitting: Family Medicine

## 2022-04-30 VITALS — BP 128/80 | HR 60 | Temp 98.1°F | Ht 73.0 in | Wt 207.8 lb

## 2022-04-30 DIAGNOSIS — Z0000 Encounter for general adult medical examination without abnormal findings: Secondary | ICD-10-CM | POA: Diagnosis not present

## 2022-04-30 DIAGNOSIS — Z23 Encounter for immunization: Secondary | ICD-10-CM

## 2022-04-30 DIAGNOSIS — Z125 Encounter for screening for malignant neoplasm of prostate: Secondary | ICD-10-CM

## 2022-04-30 DIAGNOSIS — R7301 Impaired fasting glucose: Secondary | ICD-10-CM | POA: Diagnosis not present

## 2022-04-30 DIAGNOSIS — E7800 Pure hypercholesterolemia, unspecified: Secondary | ICD-10-CM | POA: Diagnosis not present

## 2022-04-30 DIAGNOSIS — I10 Essential (primary) hypertension: Secondary | ICD-10-CM

## 2022-04-30 LAB — COMPREHENSIVE METABOLIC PANEL
ALT: 38 U/L (ref 0–53)
AST: 17 U/L (ref 0–37)
Albumin: 4.8 g/dL (ref 3.5–5.2)
Alkaline Phosphatase: 56 U/L (ref 39–117)
BUN: 16 mg/dL (ref 6–23)
CO2: 27 mEq/L (ref 19–32)
Calcium: 9.9 mg/dL (ref 8.4–10.5)
Chloride: 103 mEq/L (ref 96–112)
Creatinine, Ser: 0.83 mg/dL (ref 0.40–1.50)
GFR: 98.32 mL/min (ref >60.00)
Glucose, Bld: 103 mg/dL — ABNORMAL HIGH (ref 70–99)
Potassium: 4.3 mEq/L (ref 3.5–5.1)
Sodium: 139 mEq/L (ref 135–145)
Total Bilirubin: 0.8 mg/dL (ref 0.2–1.2)
Total Protein: 7.2 g/dL (ref 6.0–8.3)

## 2022-04-30 LAB — LIPID PANEL
Cholesterol: 140 mg/dL (ref 0–200)
HDL: 55.1 mg/dL (ref >39.00)
LDL Cholesterol: 74 mg/dL (ref 0–99)
NonHDL: 84.45
Total CHOL/HDL Ratio: 3
Triglycerides: 52 mg/dL (ref 0.0–149.0)
VLDL: 10.4 mg/dL (ref 0.0–40.0)

## 2022-04-30 LAB — CBC
HCT: 43.8 % (ref 39.0–52.0)
Hemoglobin: 15.4 g/dL (ref 13.0–17.0)
MCHC: 35.1 g/dL (ref 30.0–36.0)
MCV: 95.9 fl (ref 78.0–100.0)
Platelets: 194 10*3/uL (ref 150.0–400.0)
RBC: 4.56 Mil/uL (ref 4.22–5.81)
RDW: 12.1 % (ref 11.5–15.5)
WBC: 3.5 10*3/uL — ABNORMAL LOW (ref 4.0–10.5)

## 2022-04-30 LAB — PSA: PSA: 1.86 ng/mL (ref 0.10–4.00)

## 2022-04-30 LAB — HEMOGLOBIN A1C: Hgb A1c MFr Bld: 5.6 % (ref 4.6–6.5)

## 2022-04-30 LAB — TSH: TSH: 1.33 u[IU]/mL (ref 0.35–5.50)

## 2022-04-30 MED ORDER — ATORVASTATIN CALCIUM 20 MG PO TABS
20.0000 mg | ORAL_TABLET | Freq: Every day | ORAL | 3 refills | Status: DC
Start: 2022-04-30 — End: 2023-05-04

## 2022-04-30 MED ORDER — LISINOPRIL 20 MG PO TABS
20.0000 mg | ORAL_TABLET | Freq: Every day | ORAL | 3 refills | Status: DC
Start: 2022-04-30 — End: 2023-05-04

## 2022-04-30 NOTE — Patient Instructions (Signed)
[facility]  Address:  [address], [place], [place] [address]  Phone: [phone]

## 2022-04-30 NOTE — Progress Notes (Signed)
Office Note 04/30/2022  CC:  Chief Complaint  Patient presents with   Annual Exam    Pt is fasting ( coke zero)    HPI:  Patient is a 57 y.o. male who is here for annual health maintenance exam and follow-up hypertension and hyperlipidemia.  Brett Swanson is feeling well. Does not monitor blood pressure at home. He is active and eats healthy.   Past Medical History:  Diagnosis Date   Depression    Family history of prostate cancer in father    GERD (gastroesophageal reflux disease)    Heart murmur, systolic    2 D ECHO normal 09/2010   History of adenomatous polyp of colon 04/2018   Recall 3-5 yrs   History of alcohol abuse    1 bottle of wine per day; began cutting back 2012   Hyperlipidemia    Statin initiated 03/2019   Hypertension    IFG (impaired fasting glucose)    a1cs have been normal, most recently 03/2020    Past Surgical History:  Procedure Laterality Date   ADENOIDECTOMY  1973   COLONOSCOPY W/ POLYPECTOMY  05/02/2018   Adenomatous polyps (multiple). Recall 3-5 yrs   EYE MUSCLE SURGERY  2005   hernia 1972     TONSILLECTOMY  1973   TRANSTHORACIC ECHOCARDIOGRAM  10/03/2010   Normal except grd I DD.    Family History  Problem Relation Age of Onset   Alcohol abuse Mother    Cancer Mother 8       Breast, throat, lung   Esophageal cancer Mother    Alcohol abuse Father    Heart disease Father        enlarged heart   COPD Father    Hypertension Father    Cancer Father 87       prostate   Rectal cancer Neg Hx    Stomach cancer Neg Hx    Colon cancer Neg Hx     Social History   Socioeconomic History   Marital status: Married    Spouse name: Brett Swanson   Number of children: 1   Years of education: 4   Highest education level: Bachelor's degree (e.g., BA, AB, BS)  Occupational History   Occupation: Scientist, clinical (histocompatibility and immunogenetics): [employer]    Comment: Sr. Psychologist, sport and exercise  Tobacco Use   Smoking status: Light Smoker    Years: 4.00    Types: Cigarettes   Smokeless  tobacco: Never   Tobacco comments:    <1 pk per week  Substance and Sexual Activity   Alcohol use: Yes   Drug use: No   Sexual activity: Yes  Other Topics Concern   Not on file  Social History Narrative   Married, one daughter.  Psychologist, sport and exercise for [employer].  Exercise: fast walking.   Nonsmoker.  Alcohol: bottle of wine per day at one point but cut way back.  No drug abuse.   Social Determinants of Health   Financial Resource Strain: Low Risk  (10/17/2021)   Overall Financial Resource Strain (CARDIA)    Difficulty of Paying Living Expenses: Not very hard  Food Insecurity: No Food Insecurity (10/17/2021)   Hunger Vital Sign    Worried About Running Out of Food in the Last Year: Never true    Ran Out of Food in the Last Year: Never true  Transportation Needs: No Transportation Needs (10/17/2021)   PRAPARE - Hydrologist (Medical): No    Lack of Transportation (Non-Medical): No  Physical Activity: Sufficiently Active (10/17/2021)   Exercise Vital Sign    Days of Exercise per Week: 5 days    Minutes of Exercise per Session: 30 min  Stress: No Stress Concern Present (10/17/2021)   [place]    Feeling of Stress : Only a little  Social Connections: Socially Integrated (10/17/2021)   Social Connection and Isolation Panel [NHANES]    Frequency of Communication with Friends and Family: Three times a week    Frequency of Social Gatherings with Friends and Family: Twice a week    Attends Religious Services: More than 4 times per year    Active Member of [employer] or Organizations: Yes    Attends Music therapist: More than 4 times per year    Marital Status: Married  Human resources officer Violence: Not on file    Outpatient Medications Prior to Visit  Medication Sig Dispense Refill   Calcium Carbonate Antacid (TUMS PO) Take by mouth as needed.     meclizine (ANTIVERT) 25 MG  tablet Take 1 tablet (25 mg total) by mouth 3 (three) times daily as needed for dizziness. 30 tablet 0   atorvastatin (LIPITOR) 20 MG tablet Take 1 tablet (20 mg total) by mouth daily. 30 tablet 0   lisinopril (ZESTRIL) 20 MG tablet Take 1 tablet (20 mg total) by mouth daily. 90 tablet 3   No facility-administered medications prior to visit.    No Known Allergies  Review of Systems  Constitutional:  Negative for appetite change, chills, fatigue and fever.  HENT:  Negative for congestion, dental problem, ear pain and sore throat.   Eyes:  Negative for discharge, redness and visual disturbance.  Respiratory:  Negative for cough, chest tightness, shortness of breath and wheezing.   Cardiovascular:  Negative for chest pain, palpitations and leg swelling.  Gastrointestinal:  Negative for abdominal pain, blood in stool, diarrhea, nausea and vomiting.  Genitourinary:  Negative for difficulty urinating, dysuria, flank pain, frequency, hematuria and urgency.  Musculoskeletal:  Negative for arthralgias, back pain, joint swelling, myalgias and neck stiffness.  Skin:  Negative for pallor and rash.  Neurological:  Negative for dizziness, speech difficulty, weakness and headaches.  Hematological:  Negative for adenopathy. Does not bruise/bleed easily.  Psychiatric/Behavioral:  Negative for confusion and sleep disturbance. The patient is not nervous/anxious.     PE;    04/30/2022    9:42 AM 10/20/2021    8:04 AM 04/21/2021   10:11 AM  Vitals with BMI  Height '6\' 1"'$  6' 0.5" 6' 0.5"  Weight 207 lbs 13 oz 198 lbs 3 oz 213 lbs 10 oz  BMI 27.42 84.1 32.44  Systolic 010 272 536  Diastolic 84 76 82  Pulse 60 44    Initial bp today 145/84. Rpt at end of visit: 128/80  Gen: Alert, well appearing.  Patient is oriented to person, place, time, and situation. AFFECT: pleasant, lucid thought and speech. ENT: Ears: EACs clear, normal epithelium.  TMs with good light reflex and landmarks bilaterally.  Eyes:  no injection, icteris, swelling, or exudate.  EOMI, PERRLA. Nose: no drainage or turbinate edema/swelling.  No injection or focal lesion.  Mouth: lips without lesion/swelling.  Oral mucosa pink and moist.  Dentition intact and without obvious caries or gingival swelling.  Oropharynx without erythema, exudate, or swelling.  Neck: supple/nontender.  No LAD, mass, or TM.  Carotid pulses 2+ bilaterally, without bruits. CV: RRR, no m/r/g.   LUNGS: CTA bilat,  nonlabored resps, good aeration in all lung fields. ABD: soft, NT, ND, BS normal.  No hepatospenomegaly or mass.  No bruits. EXT: no clubbing, cyanosis, or edema.  Musculoskeletal: no joint swelling, erythema, warmth, or tenderness.  ROM of all joints intact. Skin - no sores or suspicious lesions or rashes or color changes   Pertinent labs:  Lab Results  Component Value Date   TSH 1.44 04/21/2021   Lab Results  Component Value Date   WBC 3.5 (L) 04/21/2021   HGB 14.6 04/21/2021   HCT 42.7 04/21/2021   MCV 93.4 04/21/2021   PLT 200.0 04/21/2021   Lab Results  Component Value Date   CREATININE 0.88 10/20/2021   BUN 17 10/20/2021   NA 138 10/20/2021   K 4.9 10/20/2021   CL 104 10/20/2021   CO2 27 10/20/2021   Lab Results  Component Value Date   ALT 36 10/20/2021   AST 17 10/20/2021   ALKPHOS 51 10/20/2021   BILITOT 1.1 10/20/2021   Lab Results  Component Value Date   CHOL 132 10/20/2021   Lab Results  Component Value Date   HDL 61.90 10/20/2021   Lab Results  Component Value Date   LDLCALC 56 10/20/2021   Lab Results  Component Value Date   TRIG 69.0 10/20/2021   Lab Results  Component Value Date   CHOLHDL 2 10/20/2021   Lab Results  Component Value Date   PSA 1.63 04/21/2021   PSA 1.19 04/17/2020   PSA 1.79 04/17/2019   Lab Results  Component Value Date   HGBA1C 5.5 04/21/2021   ASSESSMENT AND PLAN:   #1 health maintenance exam: Reviewed age and gender appropriate health maintenance issues  (prudent diet, regular exercise, health risks of tobacco and excessive alcohol, use of seatbelts, fire alarms in home, use of sunscreen).  Also reviewed age and gender appropriate health screening as well as vaccine recommendations. Vaccines: Flu today, otherwise All UTD. Labs: fasting HP, Hba1c (IFG), PSA. Prostate ca screening: PSA today. Colon ca screening: hx polyps, due for 3-5 yr recall 04/2021-04/2023.  #2 hypertension, whitecoat component.  Initial blood pressure a little bit up here but repeat normal. Continue lisinopril 20 mg a day. Electrolytes and creatinine today.  3.  Hyperlipidemia, doing well on atorvastatin 20 mg a day. Lipid panel and hepatic panel today.  An After Visit Summary was printed and given to the patient.  FOLLOW UP:  Return in about 6 months (around 10/29/2022) for routine chronic illness f/u.  Signed:  Crissie Sickles, MD           04/30/2022

## 2022-09-28 ENCOUNTER — Encounter: Payer: Self-pay | Admitting: Family Medicine

## 2022-09-28 ENCOUNTER — Encounter: Payer: Self-pay | Admitting: Gastroenterology

## 2022-10-14 ENCOUNTER — Ambulatory Visit (AMBULATORY_SURGERY_CENTER): Payer: BC Managed Care – PPO

## 2022-10-14 VITALS — Ht 73.0 in | Wt 205.0 lb

## 2022-10-14 DIAGNOSIS — Z86010 Personal history of colonic polyps: Secondary | ICD-10-CM

## 2022-10-14 MED ORDER — NA SULFATE-K SULFATE-MG SULF 17.5-3.13-1.6 GM/177ML PO SOLN: 1.0000 | Freq: Once | ORAL | 0 refills | Status: AC

## 2022-10-14 NOTE — Progress Notes (Signed)
Pre visit completed via phone call; Patient verified name, DOB, and address;  No egg or soy allergy known to patient  No issues known to pt with past sedation with any surgeries or procedures----other than PONV; Patient denies ever being told they had issues or difficulty with intubation;  No FH of Malignant Hyperthermia; Pt is not on diet pills; Pt is not on home 02;  Pt is not on blood thinners;  Pt denies issues with constipation;  No A fib or A flutter;  Have any cardiac testing pending--NO Insurance verified during PV appt--- BCBS  Pt can ambulate without assistance;  Pt denies use of chewing tobacco; Discussed diabetic/weight loss medication holds; Discussed NSAID holds; Checked BMI to be less than 50; Pt instructed to use Singlecare.com or GoodRx for a price reduction on prep  Patient's chart reviewed by Cathlyn Parsons CNRA prior to previsit and patient appropriate for the [facility].  Pre visit completed and red dot placed by patient's name on their procedure day (on provider's schedule).    Instructions sent to patient via MyChart per his request;

## 2022-10-23 ENCOUNTER — Encounter: Payer: Self-pay | Admitting: Gastroenterology

## 2022-10-29 ENCOUNTER — Ambulatory Visit: Payer: BC Managed Care – PPO | Admitting: Family Medicine

## 2022-10-29 ENCOUNTER — Encounter: Payer: Self-pay | Admitting: Family Medicine

## 2022-10-29 VITALS — BP 132/82 | HR 46 | Wt 217.8 lb

## 2022-10-29 DIAGNOSIS — I10 Essential (primary) hypertension: Secondary | ICD-10-CM

## 2022-10-29 DIAGNOSIS — E7800 Pure hypercholesterolemia, unspecified: Secondary | ICD-10-CM | POA: Diagnosis not present

## 2022-10-29 LAB — COMPREHENSIVE METABOLIC PANEL
ALT: 53 U/L (ref 0–53)
AST: 21 U/L (ref 0–37)
Albumin: 4.7 g/dL (ref 3.5–5.2)
Alkaline Phosphatase: 56 U/L (ref 39–117)
BUN: 15 mg/dL (ref 6–23)
CO2: 30 mEq/L (ref 19–32)
Calcium: 9.7 mg/dL (ref 8.4–10.5)
Chloride: 102 mEq/L (ref 96–112)
Creatinine, Ser: 0.95 mg/dL (ref 0.40–1.50)
GFR: 89.6 mL/min (ref >60.00)
Glucose, Bld: 124 mg/dL — ABNORMAL HIGH (ref 70–99)
Potassium: 5 mEq/L (ref 3.5–5.1)
Sodium: 136 mEq/L (ref 135–145)
Total Bilirubin: 1.1 mg/dL (ref 0.2–1.2)
Total Protein: 7 g/dL (ref 6.0–8.3)

## 2022-10-29 LAB — LIPID PANEL
Cholesterol: 160 mg/dL (ref 0–200)
HDL: 55.4 mg/dL (ref >39.00)
LDL Cholesterol: 73 mg/dL (ref 0–99)
NonHDL: 104.69
Total CHOL/HDL Ratio: 3
Triglycerides: 157 mg/dL — ABNORMAL HIGH (ref 0.0–149.0)
VLDL: 31.4 mg/dL (ref 0.0–40.0)

## 2022-10-29 NOTE — Progress Notes (Signed)
OFFICE VISIT  10/29/2022  CC:  Chief Complaint  Patient presents with   Medical Management of Chronic Issues    Patient is a 57 y.o. male who presents for 11-month follow-up hypertension and hyperlipidemia. A/P as of last visit: "#1 hypertension, whitecoat component.  Initial blood pressure a little bit up here but repeat normal. Continue lisinopril 20 mg a day. Electrolytes and creatinine today.   2  Hyperlipidemia, doing well on atorvastatin 20 mg a day. Lipid panel and hepatic panel today."  INTERIM HX: Feeling well. Has gained some wt back.  No acute concerns.  ROS -> no fevers, no CP, no SOB, no wheezing, no cough, no dizziness, no HAs, no rashes, no melena/hematochezia.  No polyuria or polydipsia.  No myalgias or arthralgias.  No focal weakness, paresthesias, or tremors.  No acute vision or hearing abnormalities.  No dysuria or unusual/new urinary urgency or frequency.  No recent changes in lower legs. No n/v/d or abd pain.  No palpitations.    Past Medical History:  Diagnosis Date   Depression    Family history of prostate cancer in father    GERD (gastroesophageal reflux disease)    on meds   Heart murmur, systolic    2 D ECHO normal 09/2010   History of adenomatous polyp of colon 04/2018   Recall 3-5 yrs   History of alcohol abuse    1 bottle of wine per day; began cutting back 2012   Hyperlipidemia    on meds   Hypertension    on meds   IFG (impaired fasting glucose)    a1cs have been normal, most recently 03/2020   Seasonal allergies     Past Surgical History:  Procedure Laterality Date   COLONOSCOPY W/ POLYPECTOMY  05/02/2018   VC-MAC-suprep(adeq)-Adenomatous polyps (multiple). Recall 3-5 yrs   EYE MUSCLE SURGERY  2005   INGUINAL HERNIA REPAIR Bilateral 1972   TONSILLECTOMY AND ADENOIDECTOMY  1973   TRANSTHORACIC ECHOCARDIOGRAM  10/03/2010   Normal except grd I DD.   WISDOM TOOTH EXTRACTION  1999    Outpatient Medications Prior to Visit  Medication  Sig Dispense Refill   atorvastatin (LIPITOR) 20 MG tablet Take 1 tablet (20 mg total) by mouth daily. 90 tablet 3   Calcium Carbonate Antacid (TUMS PO) Take 1 tablet by mouth as needed.     lisinopril (ZESTRIL) 20 MG tablet Take 1 tablet (20 mg total) by mouth daily. 90 tablet 3   No facility-administered medications prior to visit.    No Known Allergies  Review of Systems As per HPI  PE:    10/29/2022    8:40 AM 10/14/2022    8:38 AM 04/30/2022   10:09 AM  Vitals with BMI  Height  6\' 1"    Weight 217 lbs 13 oz 205 lbs   BMI 28.74 27.05   Systolic 132  128  Diastolic 82  80  Pulse 46       Physical Exam  Gen: Alert, well appearing.  Patient is oriented to person, place, time, and situation. AFFECT: pleasant, lucid thought and speech. No further exam today  LABS:  Last CBC Lab Results  Component Value Date   WBC 3.5 (L) 04/30/2022   HGB 15.4 04/30/2022   HCT 43.8 04/30/2022   MCV 95.9 04/30/2022   RDW 12.1 04/30/2022   PLT 194.0 04/30/2022   Last metabolic panel Lab Results  Component Value Date   GLUCOSE 103 (H) 04/30/2022   NA 139 04/30/2022  K 4.3 04/30/2022   CL 103 04/30/2022   CO2 27 04/30/2022   BUN 16 04/30/2022   CREATININE 0.83 04/30/2022   GFR 98.32 04/30/2022   CALCIUM 9.9 04/30/2022   PROT 7.2 04/30/2022   ALBUMIN 4.8 04/30/2022   BILITOT 0.8 04/30/2022   ALKPHOS 56 04/30/2022   AST 17 04/30/2022   ALT 38 04/30/2022   Last lipids Lab Results  Component Value Date   CHOL 140 04/30/2022   HDL 55.10 04/30/2022   LDLCALC 74 04/30/2022   LDLDIRECT 121.7 08/24/2011   TRIG 52.0 04/30/2022   CHOLHDL 3 04/30/2022   Last hemoglobin A1c Lab Results  Component Value Date   HGBA1C 5.6 04/30/2022   Last thyroid functions Lab Results  Component Value Date   TSH 1.33 04/30/2022   IMPRESSION AND PLAN:  #1 Hypercal [employer], doing well on a atorvastatin 20 mg a day. Lipid panel today.  2.  Hypertension, well-controlled on lisinopril 20  mg a day. Electrolytes and creatinine today.  An After Visit Summary was printed and given to the patient.  FOLLOW UP: Return in about 6 months (around 05/01/2023) for annual CPE (fasting).  Signed:  Santiago Bumpers, MD           10/29/2022

## 2022-10-30 NOTE — Progress Notes (Signed)
Added

## 2022-11-01 ENCOUNTER — Encounter: Payer: Self-pay | Admitting: Certified Registered Nurse Anesthetist

## 2022-11-05 ENCOUNTER — Ambulatory Visit (AMBULATORY_SURGERY_CENTER): Payer: BC Managed Care – PPO | Admitting: Gastroenterology

## 2022-11-05 ENCOUNTER — Encounter: Payer: Self-pay | Admitting: Gastroenterology

## 2022-11-05 VITALS — BP 108/66 | HR 52 | Temp 97.5°F | Resp 12 | Ht 73.0 in | Wt 205.0 lb

## 2022-11-05 DIAGNOSIS — Z09 Encounter for follow-up examination after completed treatment for conditions other than malignant neoplasm: Secondary | ICD-10-CM | POA: Diagnosis present

## 2022-11-05 DIAGNOSIS — Z86010 Personal history of colonic polyps: Secondary | ICD-10-CM | POA: Diagnosis not present

## 2022-11-05 DIAGNOSIS — D128 Benign neoplasm of rectum: Secondary | ICD-10-CM

## 2022-11-05 MED ORDER — SODIUM CHLORIDE 0.9 % IV SOLN: 500.0000 mL | Freq: Once | INTRAVENOUS | Status: DC

## 2022-11-05 NOTE — Op Note (Signed)
Brett Swanson Patient Name: Brett Swanson Procedure Date: 11/05/2022 3:30 PM MRN: [id number] Endoscopist: Doristine Locks , MD, [id number] Age: 57 Referring MD:  Date of Birth: 09-29-1965 Gender: Male Account #: [id number] Procedure:                Colonoscopy Indications:              High risk colon cancer surveillance: Personal                            history of colonic polyps                           Last colonoscopy was 04/2018 and notable for 2                            subcentimeter transverse colon sessile serrated                            polyps, 4 subcentimeter sigmoid polyps (adenoma x                            2, HP x 1), internal hemorrhoids with                            recommendation to repeat in 3 years. Medicines:                Monitored Anesthesia Care Procedure:                Pre-Anesthesia Assessment:                           - Prior to the procedure, a History and Physical                            was performed, and patient medications and                            allergies were reviewed. The patient's tolerance of                            previous anesthesia was also reviewed. The risks                            and benefits of the procedure and the sedation                            options and risks were discussed with the patient.                            All questions were answered, and informed consent                            was obtained. Prior Anticoagulants: The patient has  taken no anticoagulant or antiplatelet agents. ASA                            Grade Assessment: II - A patient with mild systemic                            disease. After reviewing the risks and benefits,                            the patient was deemed in satisfactory condition to                            undergo the procedure.                           After obtaining informed consent, the colonoscope                             was passed under direct vision. Throughout the                            procedure, the patient's blood pressure, pulse, and                            oxygen saturations were monitored continuously. The                            CF [id number] #[id number] was introduced through the anus                            and advanced to the the cecum, identified by                            appendiceal orifice and ileocecal valve. The                            colonoscopy was performed without difficulty. The                            patient tolerated the procedure well. The quality                            of the bowel preparation was good. The ileocecal                            valve, appendiceal orifice, and rectum were                            photographed. Scope In: 3:39:28 PM Scope Out: 3:57:18 PM Scope Withdrawal Time: 0 hours 13 minutes 55 seconds  Total Procedure Duration: 0 hours 17 minutes 50 seconds  Findings:                 The perianal and digital rectal examinations were  normal.                           Two sessile polyps were found in the rectum. The                            polyps were 3 to 4 mm in size. These polyps were                            removed with a cold snare. Resection and retrieval                            were complete. Estimated blood loss was minimal.                           A small post polypectomy scar was found in the                            transverse colon. The scar tissue was healthy in                            appearance. There was no evidence of the previous                            polyp.                           There was a small lipoma in the sigmoid colon. The                            exam was otherwise normal throughout the remainder                            of the colon.                           The retroflexed view of the distal rectum and anal                            verge was  normal and showed no anal or rectal                            abnormalities. Complications:            No immediate complications. Estimated Blood Loss:     Estimated blood loss was minimal. Impression:               - Two 3 to 4 mm polyps in the rectum, removed with                            a cold snare. Resected and retrieved.                           - Post-polypectomy scar in the transverse colon.                           -  The distal rectum and anal verge are normal on                            retroflexion view. Recommendation:           - Patient has a contact number available for                            emergencies. The signs and symptoms of potential                            delayed complications were discussed with the                            patient. Return to normal activities tomorrow.                            Written discharge instructions were provided to the                            patient.                           - Resume previous diet.                           - Continue present medications.                           - Await pathology results.                           - Repeat colonoscopy in 5 years for surveillance                            based on pathology results.                           - Return to GI office PRN. Doristine Locks, MD 11/05/2022 4:01:54 PM

## 2022-11-05 NOTE — Progress Notes (Signed)
GASTROENTEROLOGY PROCEDURE H&P NOTE   Primary Care Physician: Jeoffrey Massed, MD    Reason for Procedure:  Colon polyp surveillance  Plan:    Colonoscopy  Patient is appropriate for endoscopic procedure(s) in the ambulatory ([facility]) setting.  The nature of the procedure, as well as the risks, benefits, and alternatives were carefully and thoroughly reviewed with the patient. Ample time for discussion and questions allowed. The patient understood, was satisfied, and agreed to proceed.     HPI: Brett Swanson is a 57 y.o. male who presents for colonoscopy for ongoing colon polyp surveillance.  No active GI symptoms.  No known family history of colon cancer or related malignancy.    Last colonoscopy was 04/2018 and notable for 2 subcentimeter transverse colon sessile serrated polyps, 4 subcentimeter sigmoid polyps (adenoma x 2, HP x 1), internal hemorrhoids with recommendation to repeat in 3 years.  Past Medical History:  Diagnosis Date   Depression    Family history of prostate cancer in father    GERD (gastroesophageal reflux disease)    on meds   Heart murmur, systolic    2 D ECHO normal 09/2010   History of adenomatous polyp of colon 04/2018   Recall 3-5 yrs   History of alcohol abuse    1 bottle of wine per day; began cutting back 2012   Hyperlipidemia    on meds   Hypertension    on meds   IFG (impaired fasting glucose)    a1cs have been normal, most recently 03/2020   Seasonal allergies     Past Surgical History:  Procedure Laterality Date   COLONOSCOPY W/ POLYPECTOMY  05/02/2018   VC-MAC-suprep(adeq)-Adenomatous polyps (multiple). Recall 3-5 yrs   EYE MUSCLE SURGERY  2005   INGUINAL HERNIA REPAIR Bilateral 1972   TONSILLECTOMY AND ADENOIDECTOMY  1973   TRANSTHORACIC ECHOCARDIOGRAM  10/03/2010   Normal except grd I DD.   WISDOM TOOTH EXTRACTION  1999    Prior to Admission medications   Medication Sig Start Date End Date Taking? Authorizing Provider   atorvastatin (LIPITOR) 20 MG tablet Take 1 tablet (20 mg total) by mouth daily. 04/30/22  Yes McGowen, Maryjean Morn, MD  Calcium Carbonate Antacid (TUMS PO) Take 1 tablet by mouth as needed.   Yes [provider]  lisinopril (ZESTRIL) 20 MG tablet Take 1 tablet (20 mg total) by mouth daily. 04/30/22  Yes McGowen, Maryjean Morn, MD    Current Outpatient Medications  Medication Sig Dispense Refill   atorvastatin (LIPITOR) 20 MG tablet Take 1 tablet (20 mg total) by mouth daily. 90 tablet 3   Calcium Carbonate Antacid (TUMS PO) Take 1 tablet by mouth as needed.     lisinopril (ZESTRIL) 20 MG tablet Take 1 tablet (20 mg total) by mouth daily. 90 tablet 3   Current Facility-Administered Medications  Medication Dose Route Frequency Provider Last Rate Last Admin   0.9 %  sodium chloride infusion  500 mL Intravenous Once Oni Dietzman V, DO        Allergies as of 11/05/2022   (No Known Allergies)    Family History  Problem Relation Age of Onset   Breast cancer Mother 29   Alcohol abuse Mother    Throat cancer Mother 53   Lung cancer Mother 61   Alcohol abuse Father    Heart disease Father        enlarged heart   COPD Father    Hypertension Father    Cancer Father 44  prostate   Rectal cancer Neg Hx    Stomach cancer Neg Hx    Colon cancer Neg Hx    Colon polyps Neg Hx    Esophageal cancer Neg Hx     Social History   Socioeconomic History   Marital status: Married    Spouse name: Claris Che   Number of children: 1   Years of education: 4   Highest education level: Bachelor's degree (e.g., BA, AB, BS)  Occupational History   Occupation: Investment banker, corporate: [employer]    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  Best boy for [employer].  Exercise: fast walking.   Nonsmoker.  Alcohol: bottle of wine per day at one point but cut way back.  No drug abuse.   Social Determinants of Health   Financial Resource Strain: Low Risk  (10/28/2022)   Overall Financial Resource Strain (CARDIA)    Difficulty of Paying Living Expenses: Not very hard  Food Insecurity: No Food Insecurity (10/28/2022)   Hunger Vital Sign    Worried About Running Out of Food in the Last Year: Never true    Ran Out of Food in the Last Year: Never true  Transportation Needs: No Transportation Needs (10/28/2022)   PRAPARE - Administrator, Civil Service (Medical): No    Lack of Transportation (Non-Medical): No  Physical Activity: Insufficiently Active (10/28/2022)   Exercise Vital Sign    Days of Exercise per Week: 2 days    Minutes of Exercise per Session: 20 min  Stress: No Stress Concern Present (10/28/2022)   [employer] of Occupational Health - Occupational Stress Questionnaire    Feeling of Stress : Only a little  Social Connections: Moderately Integrated (10/28/2022)   Social Connection and Isolation Panel [NHANES]    Frequency of Communication with Friends and Family: More than three times a week    Frequency of Social Gatherings with Friends and Family: Three times a week    Attends Religious Services: 1 to 4 times per year    Active Member of Clubs or Organizations: No    Attends Engineer, structural: Not on file    Marital Status: Married  Catering manager Violence: Not on file    Physical Exam: Vital signs in last 24 hours: @BP  108/62   Pulse (!) 55   Temp (!) 97.5 F (36.4 C) (Skin)   Ht 6\' 1"  (1.854 m)   Wt 205 lb (93 kg)   SpO2 96%   BMI 27.05 kg/m  GEN: NAD EYE: Sclerae anicteric ENT: MMM CV:  Non-tachycardic Pulm: CTA b/l GI: Soft, NT/ND NEURO:  Alert & Oriented x 3   Doristine Locks, DO [facility]   11/05/2022 3:07 PM

## 2022-11-05 NOTE — Patient Instructions (Signed)
-  Handout on polyps provided -await pathology results -repeat colonoscopy in 5 years for surveillance recommended.  -Continue present medications .  YOU HAD AN ENDOSCOPIC PROCEDURE TODAY AT [facility]:   Refer to the procedure report that was given to you for any specific questions about what was found during the examination.  If the procedure report does not answer your questions, please call your gastroenterologist to clarify.  If you requested that your care partner not be given the details of your procedure findings, then the procedure report has been included in a sealed envelope for you to review at your convenience later.  YOU SHOULD EXPECT: Some feelings of bloating in the abdomen. Passage of more gas than usual.  Walking can help get rid of the air that was put into your GI tract during the procedure and reduce the bloating. If you had a lower endoscopy (such as a colonoscopy or flexible sigmoidoscopy) you may notice spotting of blood in your stool or on the toilet paper. If you underwent a bowel prep for your procedure, you may not have a normal bowel movement for a few days.  Please Note:  You might notice some irritation and congestion in your nose or some drainage.  This is from the oxygen used during your procedure.  There is no need for concern and it should clear up in a day or so.  SYMPTOMS TO REPORT IMMEDIATELY:  Following lower endoscopy (colonoscopy or flexible sigmoidoscopy):  Excessive amounts of blood in the stool  Significant tenderness or worsening of abdominal pains  Swelling of the abdomen that is new, acute  Fever of 100F or higher  For urgent or emergent issues, a gastroenterologist can be reached at any hour by calling [phone]. Do not use MyChart messaging for urgent concerns.    DIET:  We do recommend a small meal at first, but then you may proceed to your regular diet.  Drink plenty of fluids but you should avoid alcoholic beverages for  24 hours.  ACTIVITY:  You should plan to take it easy for the rest of today and you should NOT DRIVE or use heavy machinery until tomorrow (because of the sedation medicines used during the test).    FOLLOW UP: Our staff will call the number listed on your records the next business day following your procedure.  We will call around 7:15- 8:00 am to check on you and address any questions or concerns that you may have regarding the information given to you following your procedure. If we do not reach you, we will leave a message.     If any biopsies were taken you will be contacted by phone or by letter within the next 1-3 weeks.  Please call us at [phone] if you have not heard about the biopsies in 3 weeks.    SIGNATURES/CONFIDENTIALITY: You and/or your care partner have signed paperwork which will be entered into your electronic medical record.  These signatures attest to the fact that that the information above on your After Visit Summary has been reviewed and is understood.  Full responsibility of the confidentiality of this discharge information lies with you and/or your care-partner.

## 2022-11-05 NOTE — Progress Notes (Signed)
Report given to PACU, vss 

## 2022-11-05 NOTE — Progress Notes (Signed)
Called to room to assist during endoscopic procedure.  Patient ID and intended procedure confirmed with present staff. Received instructions for my participation in the procedure from the performing physician.  

## 2022-11-06 ENCOUNTER — Telehealth: Payer: Self-pay

## 2022-11-06 NOTE — Telephone Encounter (Signed)
Attempted to reach patient for post-procedure f/u call. No answer. Left message for him to please not hesitate to call if he has any questions/concerns regarding his care. 

## 2022-12-18 ENCOUNTER — Encounter: Payer: Self-pay | Admitting: Family Medicine

## 2022-12-18 ENCOUNTER — Ambulatory Visit: Payer: BC Managed Care – PPO | Admitting: Family Medicine

## 2022-12-18 VITALS — BP 121/75 | HR 53 | Temp 97.7°F | Ht 73.0 in | Wt 217.8 lb

## 2022-12-18 DIAGNOSIS — G5622 Lesion of ulnar nerve, left upper limb: Secondary | ICD-10-CM

## 2022-12-18 DIAGNOSIS — G5623 Lesion of ulnar nerve, bilateral upper limbs: Secondary | ICD-10-CM

## 2022-12-18 DIAGNOSIS — R29898 Other symptoms and signs involving the musculoskeletal system: Secondary | ICD-10-CM | POA: Diagnosis not present

## 2022-12-18 DIAGNOSIS — G5621 Lesion of ulnar nerve, right upper limb: Secondary | ICD-10-CM

## 2022-12-18 NOTE — Progress Notes (Addendum)
OFFICE VISIT  12/18/2022  CC:  Chief Complaint  Patient presents with   Hand Pain    Pain in both elbows, left hand (pinky and ring finger) with numbness and weakness as well. Left hand worse, pain mainly in lower hand/wrist area if attempts to pick up something heavy.    Patient is a 57 y.o. male who presents for elbow pain and finger numbness.  HPI: Brett Swanson describes about 6 months of gradually progressive tingling and numbness in the fourth and fifth digits and a vague pain/discomfort in the medial aspect of both elbows.  Left arm symptoms worse than right.  Feels somewhat weak now and the fingers/hand when he tries to pick up something heavy.  He does not note any snapping at the elbow. He does sit during his day with his elbows resting on the armrest of his chair but says he does reposition fairly often.  He does type a lot.  He plays bass guitar a lot. He has not tried ice.  He does find that straightening his arm out alleviates some of the symptoms.  Review of systems: No neck pain, no shoulder pains.  No paresthesias in the thumb or index or middle finger. No tremor or problems with coordination.  No skin color changes.  Past Medical History:  Diagnosis Date   Depression    Family history of prostate cancer in father    GERD (gastroesophageal reflux disease)    on meds   Heart murmur, systolic    2 D ECHO normal 09/2010   History of adenomatous polyp of colon 04/2018   Recall 3-5 yrs   History of alcohol abuse    1 bottle of wine per day; began cutting back 2012   Hyperlipidemia    on meds   Hypertension    on meds   IFG (impaired fasting glucose)    a1cs have been normal, most recently 03/2020   Seasonal allergies     Past Surgical History:  Procedure Laterality Date   COLONOSCOPY W/ POLYPECTOMY  05/02/2018   2020 VC-MAC-suprep(adeq)-Adenomatous polyps (multiple). 10/2022 +adenomas, recall 5 yrs ([facility])   EYE MUSCLE SURGERY  2005   INGUINAL HERNIA REPAIR  Bilateral 1972   TONSILLECTOMY AND ADENOIDECTOMY  1973   TRANSTHORACIC ECHOCARDIOGRAM  10/03/2010   Normal except grd I DD.   WISDOM TOOTH EXTRACTION  1999    Outpatient Medications Prior to Visit  Medication Sig Dispense Refill   atorvastatin (LIPITOR) 20 MG tablet Take 1 tablet (20 mg total) by mouth daily. 90 tablet 3   Calcium Carbonate Antacid (TUMS PO) Take 1 tablet by mouth as needed.     lisinopril (ZESTRIL) 20 MG tablet Take 1 tablet (20 mg total) by mouth daily. 90 tablet 3   No facility-administered medications prior to visit.    No Known Allergies  Review of Systems  As per HPI  PE:    12/18/2022    8:35 AM 11/05/2022    4:19 PM 11/05/2022    4:10 PM  Vitals with BMI  Height 6\' 1"     Weight 217 lbs 13 oz    BMI 28.74    Systolic 121 108 161  Diastolic 75 66 67  Pulse 53 52 52   Physical Exam  General: Alert and well-appearing. Elbows appear without deformity, swelling, or erythema.  No tenderness to palpation.  Range of motion fully intact.  Strength in the upper arm, forearms, wrists all normal. He has a little bit of weakness in  intrinsic muscles of digits 4 and 5, left worse than right. Tinel's positive on the left at the cubital tunnel.  LABS:  Last CBC Lab Results  Component Value Date   WBC 3.5 (L) 04/30/2022   HGB 15.4 04/30/2022   HCT 43.8 04/30/2022   MCV 95.9 04/30/2022   RDW 12.1 04/30/2022   PLT 194.0 04/30/2022   Last metabolic panel Lab Results  Component Value Date   GLUCOSE 124 (H) 10/29/2022   NA 136 10/29/2022   K 5.0 10/29/2022   CL 102 10/29/2022   CO2 30 10/29/2022   BUN 15 10/29/2022   CREATININE 0.95 10/29/2022   GFR 89.60 10/29/2022   CALCIUM 9.7 10/29/2022   PROT 7.0 10/29/2022   ALBUMIN 4.7 10/29/2022   BILITOT 1.1 10/29/2022   ALKPHOS 56 10/29/2022   AST 21 10/29/2022   ALT 53 10/29/2022   Last thyroid functions Lab Results  Component Value Date   TSH 1.33 04/30/2022   IMPRESSION AND PLAN:  Bilateral  ulnar compression neuropathy at the elbows.  Left significantly worse than the right.  Discussed ice application to the elbows as needed.  Try to avoid resting elbows on surfaces.  He will try to use a splint on the left arm when he sleeps in order to try to keep it from bending a lot at the elbow. Bedside MS[facility] ultrasound today: Ulnar nerve appears mildly enlarged in the area of the cubital tunnel, left a bit worse than right.  Dynamic imaging does not show any ulnar nerve subluxation.  If symptoms continue to get worse/unbearable then will refer to neurosurgery.  Spent 31 min with pt today reviewing HPI, reviewing relevant past history, doing exam, reviewing and discussing lab and imaging data, and formulating plans.  An After Visit Summary was printed and given to the patient.  FOLLOW UP: If not significantly improving in 2 to 3 months  Signed:  Santiago Bumpers, MD           12/18/2022

## 2023-01-28 IMAGING — CR DG FINGER INDEX 2+V*L*
3 series · 3 of 3 positions shown · non-contrast
Comparison: None.

CLINICAL DATA: 54-year-old male with laceration of the left index
finger

EXAM:
LEFT INDEX FINGER 2+V

[x finger pa left]
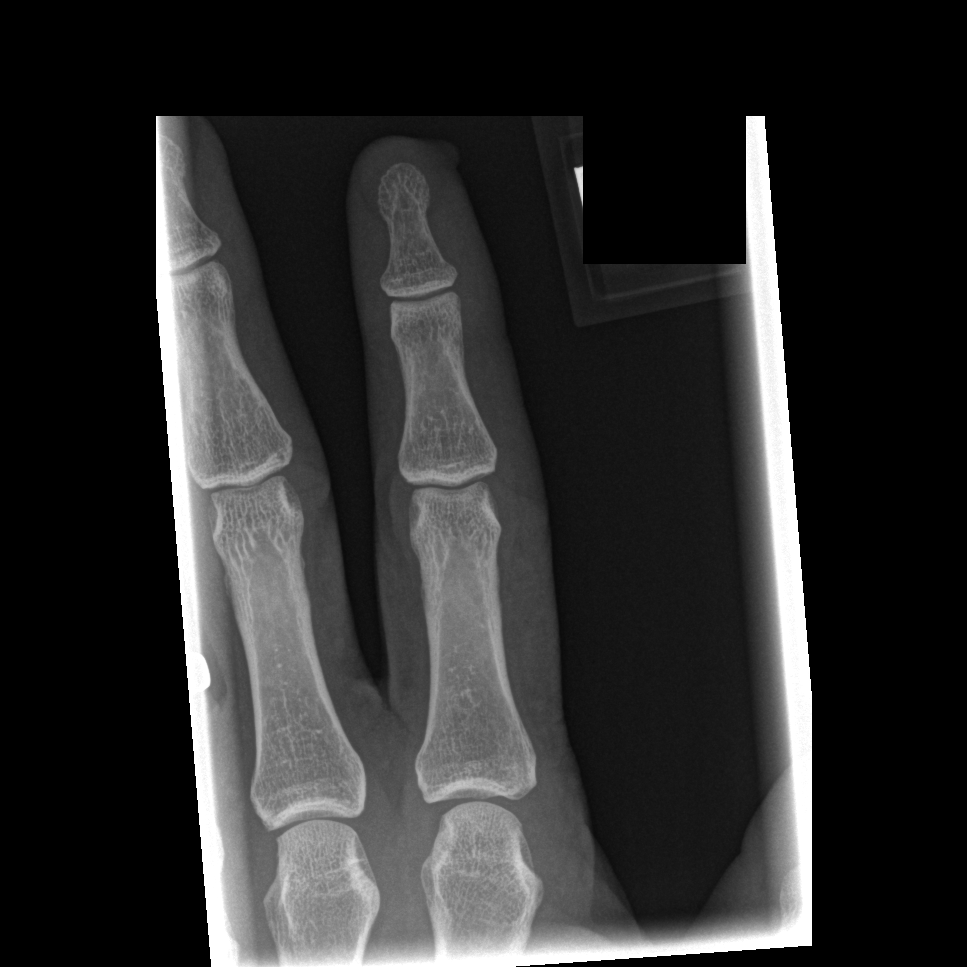

[x finger obl. left]
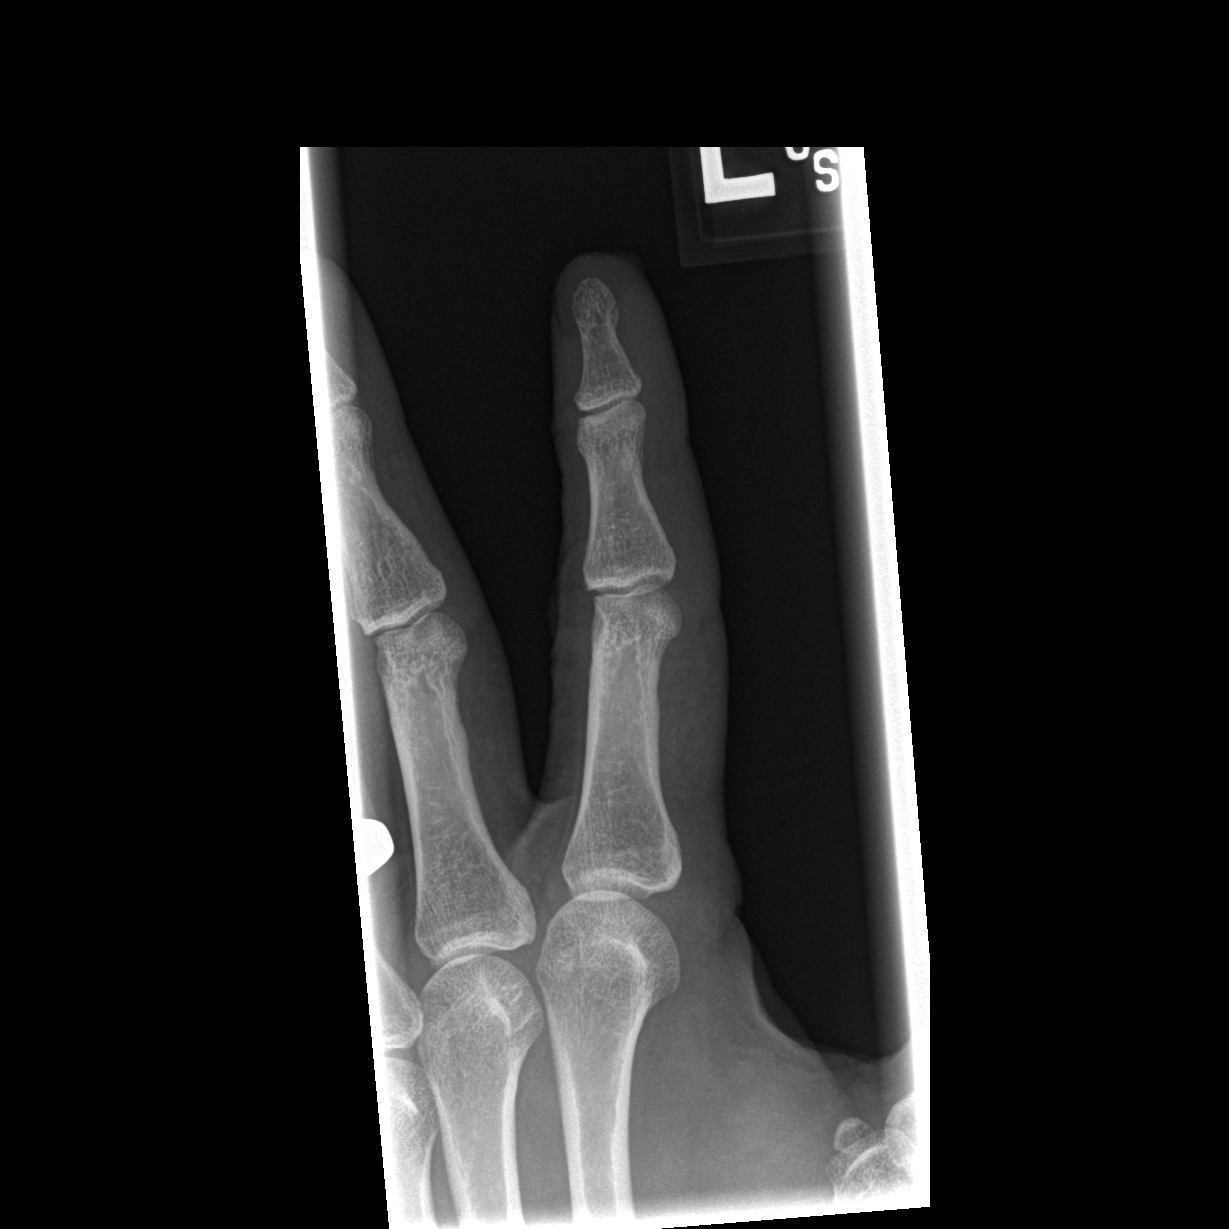

[x finger lateral left]
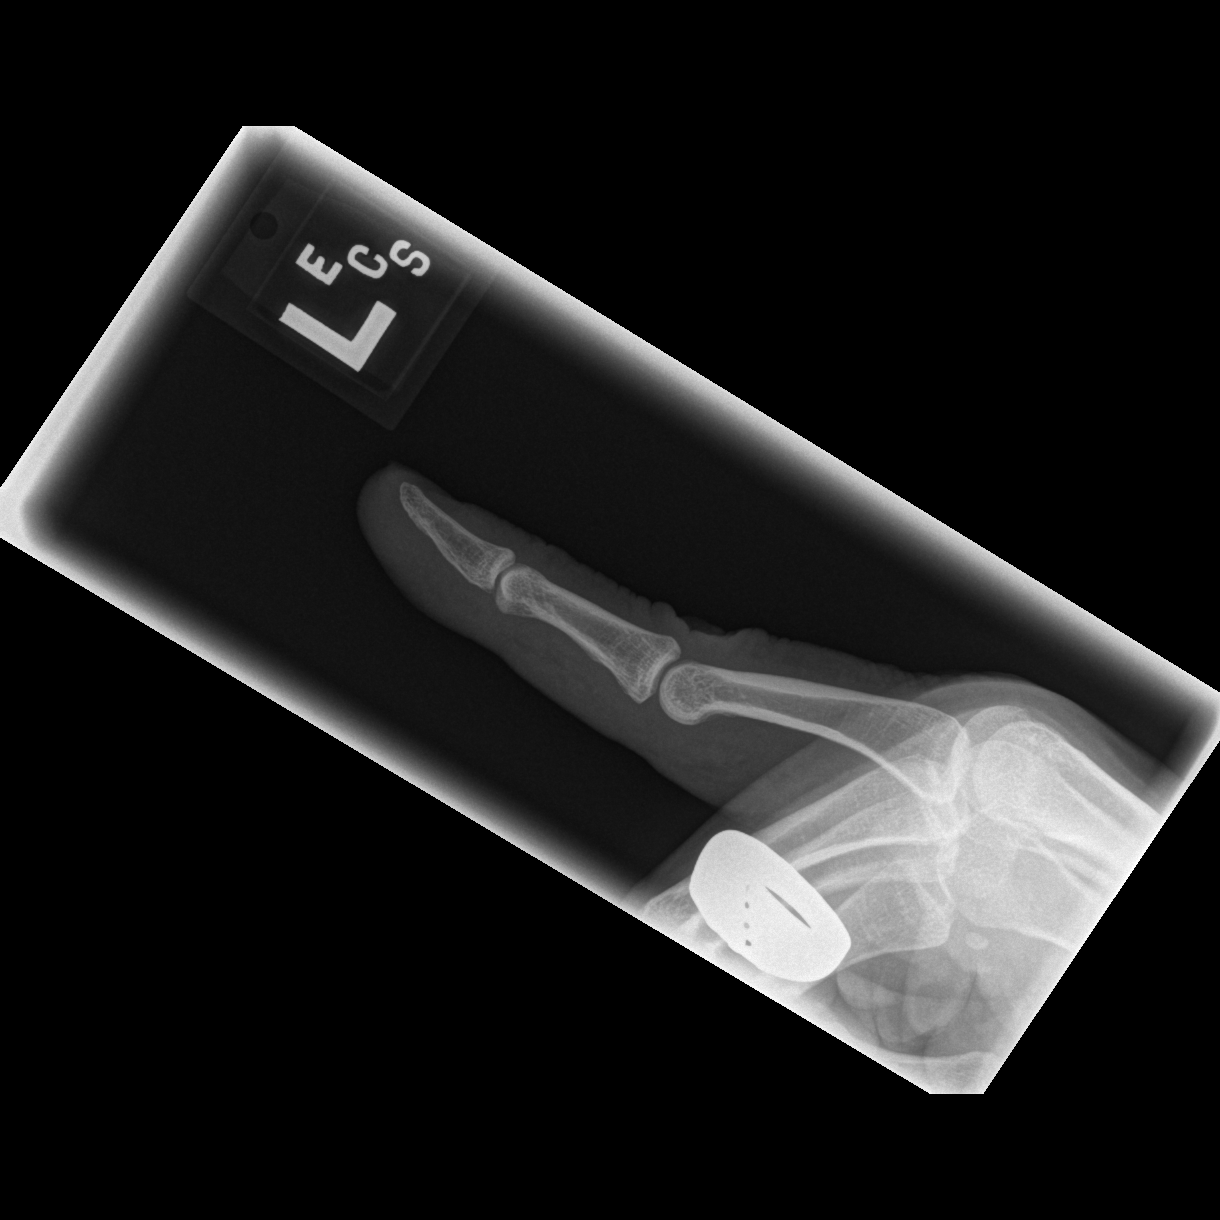

[3 of 3 positions shown; findings below may reference images not displayed]

FINDINGS: There is no acute fracture or dislocation. The bones are well
mineralized. No arthritic changes. Laceration of the skin of the tip
of the index finger. No radiopaque foreign object or soft tissue
gas.
IMPRESSION: No acute osseous pathology.

## 2023-04-23 ENCOUNTER — Other Ambulatory Visit: Payer: Self-pay | Admitting: Family Medicine

## 2023-05-03 NOTE — Patient Instructions (Signed)

## 2023-05-04 ENCOUNTER — Encounter: Payer: Self-pay | Admitting: Family Medicine

## 2023-05-04 ENCOUNTER — Ambulatory Visit: Payer: BC Managed Care – PPO | Admitting: Family Medicine

## 2023-05-04 VITALS — BP 116/70 | HR 45 | Temp 97.6°F | Ht 73.0 in | Wt 211.2 lb

## 2023-05-04 DIAGNOSIS — I10 Essential (primary) hypertension: Secondary | ICD-10-CM

## 2023-05-04 DIAGNOSIS — Z0000 Encounter for general adult medical examination without abnormal findings: Secondary | ICD-10-CM

## 2023-05-04 DIAGNOSIS — E7800 Pure hypercholesterolemia, unspecified: Secondary | ICD-10-CM | POA: Diagnosis not present

## 2023-05-04 DIAGNOSIS — R7301 Impaired fasting glucose: Secondary | ICD-10-CM

## 2023-05-04 DIAGNOSIS — Z125 Encounter for screening for malignant neoplasm of prostate: Secondary | ICD-10-CM

## 2023-05-04 LAB — CBC WITH DIFFERENTIAL/PLATELET
Basophils Absolute: 0 10*3/uL (ref 0.0–0.1)
Basophils Relative: 0.6 % (ref 0.0–3.0)
Eosinophils Absolute: 0.1 10*3/uL (ref 0.0–0.7)
Eosinophils Relative: 3.3 % (ref 0.0–5.0)
HCT: 42.7 % (ref 39.0–52.0)
Hemoglobin: 14.5 g/dL (ref 13.0–17.0)
Lymphocytes Relative: 38.7 % (ref 12.0–46.0)
Lymphs Abs: 1.3 10*3/uL (ref 0.7–4.0)
MCHC: 34 g/dL (ref 30.0–36.0)
MCV: 97.3 fL (ref 78.0–100.0)
Monocytes Absolute: 0.4 10*3/uL (ref 0.1–1.0)
Monocytes Relative: 12.7 % — ABNORMAL HIGH (ref 3.0–12.0)
Neutro Abs: 1.5 10*3/uL (ref 1.4–7.7)
Neutrophils Relative %: 44.7 % (ref 43.0–77.0)
Platelets: 188 10*3/uL (ref 150.0–400.0)
RBC: 4.39 Mil/uL (ref 4.22–5.81)
RDW: 12.8 % (ref 11.5–15.5)
WBC: 3.4 10*3/uL — ABNORMAL LOW (ref 4.0–10.5)

## 2023-05-04 LAB — COMPREHENSIVE METABOLIC PANEL
ALT: 35 U/L (ref 0–53)
AST: 17 U/L (ref 0–37)
Albumin: 4.5 g/dL (ref 3.5–5.2)
Alkaline Phosphatase: 57 U/L (ref 39–117)
BUN: 12 mg/dL (ref 6–23)
CO2: 28 meq/L (ref 19–32)
Calcium: 9.1 mg/dL (ref 8.4–10.5)
Chloride: 105 meq/L (ref 96–112)
Creatinine, Ser: 0.84 mg/dL (ref 0.40–1.50)
GFR: 97.27 mL/min (ref >60.00)
Glucose, Bld: 105 mg/dL — ABNORMAL HIGH (ref 70–99)
Potassium: 4.2 meq/L (ref 3.5–5.1)
Sodium: 139 meq/L (ref 135–145)
Total Bilirubin: 1 mg/dL (ref 0.2–1.2)
Total Protein: 6.7 g/dL (ref 6.0–8.3)

## 2023-05-04 LAB — PSA: PSA: 2.11 ng/mL (ref 0.10–4.00)

## 2023-05-04 LAB — LIPID PANEL
Cholesterol: 96 mg/dL (ref 0–200)
HDL: 39 mg/dL — ABNORMAL LOW (ref >39.00)
LDL Cholesterol: 16 mg/dL (ref 0–99)
NonHDL: 56.58
Total CHOL/HDL Ratio: 2
Triglycerides: 202 mg/dL — ABNORMAL HIGH (ref 0.0–149.0)
VLDL: 40.4 mg/dL — ABNORMAL HIGH (ref 0.0–40.0)

## 2023-05-04 LAB — TSH: TSH: 2.71 u[IU]/mL (ref 0.35–5.50)

## 2023-05-04 MED ORDER — LISINOPRIL 20 MG PO TABS: 20.0000 mg | ORAL_TABLET | Freq: Every day | ORAL | 3 refills | Status: AC

## 2023-05-04 MED ORDER — ATORVASTATIN CALCIUM 20 MG PO TABS: 20.0000 mg | ORAL_TABLET | Freq: Every day | ORAL | 3 refills | Status: AC

## 2023-05-04 NOTE — Progress Notes (Addendum)
Office Note 05/04/2023  CC:  Chief Complaint  Patient presents with   Annual Exam    Pt is fasting    HPI:  Patient is a 58 y.o. male who is here for annual health maintenance exam and 68-month follow-up hypercholesterolemia and hypertension.  Feeling very well. Walks for exercise. Has greatly improved diet the last 2 mo and has purposefully lost 16 lbs.  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  Past Medical History:  Diagnosis Date   Depression    Family history of prostate cancer in father    GERD (gastroesophageal reflux disease)    on meds   Heart murmur, systolic    2 D ECHO normal 09/2010   History of adenomatous polyp of colon 04/2018   Recall 3-5 yrs   History of alcohol abuse    1 bottle of wine per day; began cutting back 2012   Hyperlipidemia    on meds   Hypertension    on meds   IFG (impaired fasting glucose)    a1cs have been normal, most recently 03/2020   Seasonal allergies     Past Surgical History:  Procedure Laterality Date   COLONOSCOPY W/ POLYPECTOMY  05/02/2018   2020 VC-MAC-suprep(adeq)-Adenomatous polyps (multiple). 10/2022 +adenomas, recall 5 yrs (Cirigl[facility])   EYE MUSCLE SURGERY  2005   INGUINAL HERNIA REPAIR Bilateral 1972   TONSILLECTOMY AND ADENOIDECTOMY  1973   TRANSTHORACIC ECHOCARDIOGRAM  10/03/2010   Normal except grd I DD.   WISDOM TOOTH EXTRACTION  1999    Family History  Problem Relation Age of Onset   Breast cancer Mother 26   Alcohol abuse Mother    Throat cancer Mother 38   Lung cancer Mother 62   Alcohol abuse Father    Heart disease Father        enlarged heart   COPD Father    Hypertension Father    Cancer Father 78       prostate   Rectal cancer Neg Hx    Stomach cancer Neg Hx    Colon cancer Neg Hx    Colon polyps Neg Hx    Esophageal cancer Neg Hx     Social History   Socioeconomic History   Marital status: Married    Spouse name: Claris Che   Number of children: 1   Years of  education: 4   Highest education level: Bachelor's degree (e.g., BA, AB, BS)  Occupational History   Occupation: Investment banker, corporate: [employer]    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  Best boy for [employer].  Exercise: fast walking.   Nonsmoker.  Alcohol: bottle of wine per day at one point but cut way back.  No drug abuse.   Social Drivers of Corporate investment banker Strain: Low Risk  (05/03/2023)   Overall Financial Resource Strain (CARDIA)    Difficulty of Paying Living Expenses: Not very  hard  Food Insecurity: No Food Insecurity (05/03/2023)   Hunger Vital Sign    Worried About Running Out of Food in the Last Year: Never true    Ran Out of Food in the Last Year: Never true  Transportation Needs: No Transportation Needs (05/03/2023)   PRAPARE - Administrator, Civil Service (Medical): No    Lack of Transportation (Non-Medical): No  Physical Activity: Sufficiently Active (05/03/2023)   Exercise Vital Sign    Days of Exercise per Week: 7 days    Minutes of Exercise per Session: 60 min  Stress: No Stress Concern Present (05/03/2023)   [employer] of Occupational Health - Occupational Stress Questionnaire    Feeling of Stress : Only a little  Social Connections: Unknown (05/03/2023)   Social Connection and Isolation Panel [NHANES]    Frequency of Communication with Friends and Family: Three times a week    Frequency of Social Gatherings with Friends and Family: More than three times a week    Attends Religious Services: 1  to 4 times per year    Active Member of [employer] or Organizations: Patient declined    Attends Engineer, structural: Not on file    Marital Status: Married  Catering manager Violence: Not on file    Outpatient Medications Prior to Visit  Medication Sig Dispense Refill   Calcium Carbonate Antacid (TUMS PO) Take 1 tablet by mouth as needed.     atorvastatin (LIPITOR) 20 MG tablet Take 1 tablet (20 mg total) by mouth daily. 90 tablet 3   lisinopril (ZESTRIL) 20 MG tablet Take 1 tablet (20 mg total) by mouth daily. 90 tablet 3   No facility-administered medications prior to visit.    No Known Allergies  Review of Systems  Constitutional:  Negative for appetite change, chills, fatigue and fever.  HENT:  Negative for congestion, dental problem, ear pain and sore throat.   Eyes:  Negative for discharge, redness and visual disturbance.  Respiratory:  Negative for cough, chest tightness, shortness of breath and wheezing.   Cardiovascular:  Negative for chest pain, palpitations and leg swelling.  Gastrointestinal:  Negative for abdominal pain, blood in stool, diarrhea, nausea and vomiting.  Genitourinary:  Negative for difficulty urinating, dysuria, flank pain, frequency, hematuria and urgency.  Musculoskeletal:  Negative for arthralgias, back pain, joint swelling, myalgias and neck stiffness.  Skin:  Negative for pallor and rash.  Neurological:  Negative for dizziness, speech difficulty, weakness and headaches.  Hematological:  Negative for adenopathy. Does not bruise/bleed easily.  Psychiatric/Behavioral:  Negative for confusion and sleep disturbance. The patient is not nervous/anxious.    PE;    05/04/2023    8:02 AM 12/18/2022    8:35 AM 11/05/2022    4:19 PM  Vitals with BMI  Height 6\' 1"  6\' 1"    Weight 211 lbs 3 oz 217 lbs 13 oz   BMI 27.87 28.74   Systolic 116 121 161  Diastolic 70 75 66  Pulse 45 53 52    Gen: Alert, well appearing.  Patient is oriented to person,  place, time, and situation. AFFECT: pleasant, lucid thought and speech. ENT: Ears: EACs clear, normal epithelium.  TMs with good light reflex and landmarks bilaterally.  Eyes: no injection, icteris, swelling, or exudate.  EOMI, PERRLA. Nose: no drainage or turbinate edema/swelling.  No injection or focal lesion.  Mouth: lips without lesion/swelling.  Oral mucosa pink and moist.  Dentition intact and without obvious caries or gingival swelling.  Oropharynx without erythema, exudate, or swelling.  Neck: supple/nontender.  No LAD, mass, or TM.  Carotid pulses 2+ bilaterally, without bruits. CV: RRR, no m/r/g.   LUNGS: CTA bilat, nonlabored resps, good aeration in all lung fields. ABD: soft, NT, ND, BS normal.  No hepatospenomegaly or mass.  No bruits. EXT: no clubbing, cyanosis, or edema.  Musculoskeletal: no joint swelling, erythema, warmth, or tenderness.  ROM of all joints intact. Skin - no sores or suspicious lesions or rashes or color changes  Pertinent labs:  Lab Results  Component Value Date   TSH 1.33 04/30/2022   Lab Results  Component Value Date   WBC 3.5 (L) 04/30/2022   HGB 15.4 04/30/2022   HCT 43.8 04/30/2022   MCV 95.9 04/30/2022   PLT 194.0 04/30/2022   Lab Results  Component Value Date   CREATININE 0.95 10/29/2022   BUN 15 10/29/2022   NA 136 10/29/2022   K 5.0 10/29/2022   CL 102 10/29/2022   CO2 30 10/29/2022   Lab Results  Component Value Date   ALT 53 10/29/2022   AST 21 10/29/2022   ALKPHOS 56 10/29/2022   BILITOT 1.1 10/29/2022   Lab Results  Component Value Date   CHOL 160 10/29/2022   Lab Results  Component Value Date   HDL 55.40 10/29/2022   Lab Results  Component Value Date   LDLCALC 73 10/29/2022   Lab Results  Component Value Date   TRIG 157.0 (H) 10/29/2022   Lab Results  Component Value Date   CHOLHDL 3 10/29/2022   Lab Results  Component Value Date   PSA 1.86 04/30/2022   PSA 1.63 04/21/2021   PSA 1.19 04/17/2020   Lab  Results  Component Value Date   HGBA1C 5.6 04/30/2022   ASSESSMENT AND PLAN:   #1 health maintenance exam: Reviewed age and gender appropriate health maintenance issues (prudent diet, regular exercise, health risks of tobacco and excessive alcohol, use of seatbelts, fire alarms in home, use of sunscreen).  Also reviewed age and gender appropriate health screening as well as vaccine recommendations. Vaccines: All UTD. Labs: fasting HP, Hba1c (IFG), PSA. Prostate ca screening: PSA today. Colon ca screening: hx polyps, recall approx 10/2027.  #2 hypertension, well-controlled on lisinopril 20 mg a day. Electrolytes and creatinine monitoring today.  3.  Hypercholesterolemia, doing well on atorvastatin 20 mg a day. Lipid panel and hepatic panel today.  4. IFG, last fasting glucose 124 six months ago. Hemoglobin A1c at that time was 5.6%. Fasting glucose and hemoglobin A1c checked today.  An After Visit Summary was printed and given to the patient.  FOLLOW UP:  Return in about 6 months (around 11/01/2023) for routine chronic illness f/u.  Signed:  Santiago Bumpers, MD           05/04/2023

## 2023-05-04 NOTE — Addendum Note (Signed)
Addended by: Jeoffrey Massed on: 05/04/2023 08:27 AM   Modules accepted: Level of Service

## 2023-05-05 LAB — HEMOGLOBIN A1C: Hgb A1c MFr Bld: 5.5 % (ref 4.6–6.5)

## 2023-05-07 ENCOUNTER — Encounter: Payer: Self-pay | Admitting: Family Medicine

## 2023-05-07 NOTE — Telephone Encounter (Signed)
According to your provider, all labs are normal.

## 2023-10-25 ENCOUNTER — Ambulatory Visit: Admitting: Family Medicine

## 2023-10-25 ENCOUNTER — Encounter: Payer: Self-pay | Admitting: Family Medicine

## 2023-10-25 VITALS — BP 108/68 | HR 95 | Temp 98.3°F | Ht 73.0 in | Wt 192.2 lb

## 2023-10-25 DIAGNOSIS — Z4802 Encounter for removal of sutures: Secondary | ICD-10-CM | POA: Diagnosis not present

## 2023-10-25 DIAGNOSIS — S61211D Laceration without foreign body of left index finger without damage to nail, subsequent encounter: Secondary | ICD-10-CM

## 2023-10-25 NOTE — Progress Notes (Signed)
 OFFICE VISIT  10/25/2023  CC:  Chief Complaint  Patient presents with   ED follow up    Pt went to ED on 7/25 for finger laceration, tdap given.     Patient is a 58 y.o. male who presents for emergency department follow-up, finger laceration.  INTERIM HX: Brett Swanson presented to [facility] emergency department on 10/15/2023 after lacerating his left index finger with a kitchen tool. Wound was sutured and he was given a tetanus booster.  It is a little stiff still but feels fine. No swelling, bleeding, exudate, or fever.  Past Medical History:  Diagnosis Date   Alcohol abuse 07/02/2010   Depression    Family history of prostate cancer in father    GERD (gastroesophageal reflux disease)    on meds   Heart murmur, systolic    2 D ECHO normal 09/2010   History of adenomatous polyp of colon 04/2018   Recall 3-5 yrs   History of alcohol abuse    1 bottle of wine per day; began cutting back 2012   Hyperlipidemia    on meds   Hypertension    on meds   IFG (impaired fasting glucose)    a1cs have been normal, most recently 03/2020   Seasonal allergies     Past Surgical History:  Procedure Laterality Date   COLONOSCOPY W/ POLYPECTOMY  05/02/2018   2020 VC-MAC-suprep(adeq)-Adenomatous polyps (multiple). 10/2022 +adenomas, recall 5 yrs (Cirigliano)   EYE MUSCLE SURGERY  2005   INGUINAL HERNIA REPAIR Bilateral 1972   TONSILLECTOMY AND ADENOIDECTOMY  1973   TRANSTHORACIC ECHOCARDIOGRAM  10/03/2010   Normal except grd I DD.   WISDOM TOOTH EXTRACTION  1999    Outpatient Medications Prior to Visit  Medication Sig Dispense Refill   atorvastatin  (LIPITOR) 20 MG tablet Take 1 tablet (20 mg total) by mouth daily. 90 tablet 3   Calcium  Carbonate Antacid (TUMS PO) Take 1 tablet by mouth as needed.     lisinopril  (ZESTRIL ) 20 MG tablet Take 1 tablet (20 mg total) by mouth daily. 90 tablet 3   No facility-administered medications prior to visit.    No Known  Allergies  Review of Systems As per HPI  PE:    10/25/2023   11:13 AM 05/04/2023    8:02 AM 12/18/2022    8:35 AM  Vitals with BMI  Height 6' 1 6' 1 6' 1  Weight 192 lbs 3 oz 211 lbs 3 oz 217 lbs 13 oz  BMI 25.36 27.87 28.74  Systolic 108 116 878  Diastolic 68 70 75  Pulse 95 45 53     Physical Exam  Left index finger with linear laceration on distal phalanx, lateral aspect. Wound edges well-approximated.  No erythema or swelling. Although stiff, flexion and extension of all the joints of the index finger are intact.  LABS:  Last CBC Lab Results  Component Value Date   WBC 3.4 (L) 05/04/2023   HGB 14.5 05/04/2023   HCT 42.7 05/04/2023   MCV 97.3 05/04/2023   RDW 12.8 05/04/2023   PLT 188.0 05/04/2023   Last metabolic panel Lab Results  Component Value Date   GLUCOSE 105 (H) 05/04/2023   NA 139 05/04/2023   K 4.2 05/04/2023   CL 105 05/04/2023   CO2 28 05/04/2023   BUN 12 05/04/2023   CREATININE 0.84 05/04/2023   GFR 97.27 05/04/2023   CALCIUM  9.1 05/04/2023   PROT 6.7 05/04/2023   ALBUMIN 4.5 05/04/2023  BILITOT 1.0 05/04/2023   ALKPHOS 57 05/04/2023   AST 17 05/04/2023   ALT 35 05/04/2023   IMPRESSION AND PLAN:  Left index finger laceration of distal phalanx. Healing well.  I removed all 9 sutures today.  An After Visit Summary was printed and given to the patient.  FOLLOW UP: Return if symptoms worsen or fail to improve. He has [facility] follow-up appointment set for 11/11/2023 Signed:  Gerlene Hockey, MD           10/25/2023

## 2023-11-11 ENCOUNTER — Ambulatory Visit: Payer: Self-pay | Admitting: Family Medicine

## 2023-11-11 ENCOUNTER — Ambulatory Visit: Payer: BC Managed Care – PPO | Admitting: Family Medicine

## 2023-11-11 ENCOUNTER — Encounter: Payer: Self-pay | Admitting: Family Medicine

## 2023-11-11 VITALS — BP 117/71 | HR 44 | Temp 97.5°F | Ht 73.0 in | Wt 196.4 lb

## 2023-11-11 DIAGNOSIS — K644 Residual hemorrhoidal skin tags: Secondary | ICD-10-CM

## 2023-11-11 DIAGNOSIS — I10 Essential (primary) hypertension: Secondary | ICD-10-CM

## 2023-11-11 DIAGNOSIS — E7800 Pure hypercholesterolemia, unspecified: Secondary | ICD-10-CM | POA: Diagnosis not present

## 2023-11-11 DIAGNOSIS — R7301 Impaired fasting glucose: Secondary | ICD-10-CM | POA: Diagnosis not present

## 2023-11-11 LAB — COMPREHENSIVE METABOLIC PANEL WITH GFR
ALT: 30 U/L (ref 0–53)
AST: 15 U/L (ref 0–37)
Albumin: 4.7 g/dL (ref 3.5–5.2)
Alkaline Phosphatase: 56 U/L (ref 39–117)
BUN: 19 mg/dL (ref 6–23)
CO2: 27 meq/L (ref 19–32)
Calcium: 9.3 mg/dL (ref 8.4–10.5)
Chloride: 103 meq/L (ref 96–112)
Creatinine, Ser: 0.85 mg/dL (ref 0.40–1.50)
GFR: 96.57 mL/min (ref >60.00)
Glucose, Bld: 107 mg/dL — ABNORMAL HIGH (ref 70–99)
Potassium: 4.4 meq/L (ref 3.5–5.1)
Sodium: 138 meq/L (ref 135–145)
Total Bilirubin: 0.9 mg/dL (ref 0.2–1.2)
Total Protein: 6.9 g/dL (ref 6.0–8.3)

## 2023-11-11 LAB — LIPID PANEL
Cholesterol: 127 mg/dL (ref 0–200)
HDL: 57.8 mg/dL (ref >39.00)
LDL Cholesterol: 56 mg/dL (ref 0–99)
NonHDL: 69.66
Total CHOL/HDL Ratio: 2
Triglycerides: 70 mg/dL (ref 0.0–149.0)
VLDL: 14 mg/dL (ref 0.0–40.0)

## 2023-11-11 MED ORDER — HYDROCORTISONE (PERIANAL) 2.5 % EX CREA: 1.0000 | TOPICAL_CREAM | Freq: Two times a day (BID) | CUTANEOUS | 1 refills | Status: AC

## 2023-11-11 NOTE — Patient Instructions (Signed)
 Try sitz bath for treatment of painful hemorrhoids.

## 2023-11-11 NOTE — Progress Notes (Signed)
 OFFICE VISIT  11/11/2023  CC:  Chief Complaint  Patient presents with   Medical Management of Chronic Issues    Patient is a 58 y.o. male who presents for 51-month follow-up hypercholesterolemia, IFG, and hypertension. A/P as of last visit: 1 hypertension, well-controlled on lisinopril  20 mg a day. Electrolytes and creatinine monitoring today.   2.  Hypercholesterolemia, doing well on atorvastatin  20 mg a day. Lipid panel and hepatic panel today.   3. IFG, last fasting glucose 124 six months ago. Hemoglobin A1c at that time was 5.6%. Fasting glucose and hemoglobin A1c checked today.  INTERIM HX: Feeling well. He has had more of a problem with hemorrhoids over the last year.  They get uncomfortable.  No bleeding.  He treats with over-the-counter Preparation H. He denies constipation or diarrhea.  He does not do any excessive straining or lifting.  He feels like he limits his time on the toilet fairly well.  ROS --> no fevers, no CP, no SOB, no wheezing, no cough, no dizziness, no HAs, no rashes, no melena/hematochezia.  No polyuria or polydipsia.  No myalgias or arthralgias.  No focal weakness, paresthesias, or tremors.  No acute vision or hearing abnormalities.  No dysuria or unusual/new urinary urgency or frequency.  No recent changes in lower legs. No n/v/d or abd pain.  No palpitations.     Past Medical History:  Diagnosis Date   Alcohol abuse 07/02/2010   Depression    Family history of prostate cancer in father    GERD (gastroesophageal reflux disease)    on meds   Heart murmur, systolic    2 D ECHO normal 09/2010   History of adenomatous polyp of colon 04/2018   Recall 3-5 yrs   History of alcohol abuse    1 bottle of wine per day; began cutting back 2012   Hyperlipidemia    on meds   Hypertension    on meds   IFG (impaired fasting glucose)    a1cs have been normal, most recently 03/2020   Seasonal allergies     Past Surgical History:  Procedure Laterality  Date   COLONOSCOPY W/ POLYPECTOMY  05/02/2018   2020 VC-MAC-suprep(adeq)-Adenomatous polyps (multiple). 10/2022 +adenomas, recall 5 yrs (Cirigliano)   EYE MUSCLE SURGERY  2005   INGUINAL HERNIA REPAIR Bilateral 1972   TONSILLECTOMY AND ADENOIDECTOMY  1973   TRANSTHORACIC ECHOCARDIOGRAM  10/03/2010   Normal except grd I DD.   WISDOM TOOTH EXTRACTION  1999    Outpatient Medications Prior to Visit  Medication Sig Dispense Refill   atorvastatin  (LIPITOR) 20 MG tablet Take 1 tablet (20 mg total) by mouth daily. 90 tablet 3   Calcium  Carbonate Antacid (TUMS PO) Take 1 tablet by mouth as needed.     lisinopril  (ZESTRIL ) 20 MG tablet Take 1 tablet (20 mg total) by mouth daily. 90 tablet 3   No facility-administered medications prior to visit.    No Known Allergies  Review of Systems As per HPI  PE:    11/11/2023    8:04 AM 10/25/2023   11:13 AM 05/04/2023    8:02 AM  Vitals with BMI  Height 6' 1 6' 1 6' 1  Weight 196 lbs 6 oz 192 lbs 3 oz 211 lbs 3 oz  BMI 25.92 25.36 27.87  Systolic 117 108 883  Diastolic 71 68 70  Pulse 44 95 45     Physical Exam  Gen: Alert, well appearing.  Patient is oriented to person, place, time, and  situation. AFFECT: pleasant, lucid thought and speech. CV: Regular rhythm, HR 50s, no murmur EXT: no clubbing or cyanosis.  no edema.    LABS:  Last CBC Lab Results  Component Value Date   WBC 3.4 (L) 05/04/2023   HGB 14.5 05/04/2023   HCT 42.7 05/04/2023   MCV 97.3 05/04/2023   RDW 12.8 05/04/2023   PLT 188.0 05/04/2023   Last metabolic panel Lab Results  Component Value Date   GLUCOSE 105 (H) 05/04/2023   NA 139 05/04/2023   K 4.2 05/04/2023   CL 105 05/04/2023   CO2 28 05/04/2023   BUN 12 05/04/2023   CREATININE 0.84 05/04/2023   GFR 97.27 05/04/2023   CALCIUM  9.1 05/04/2023   PROT 6.7 05/04/2023   ALBUMIN 4.5 05/04/2023   BILITOT 1.0 05/04/2023   ALKPHOS 57 05/04/2023   AST 17 05/04/2023   ALT 35 05/04/2023   Last  lipids Lab Results  Component Value Date   CHOL 96 05/04/2023   HDL 39.00 (L) 05/04/2023   LDLCALC 16 05/04/2023   LDLDIRECT 121.7 08/24/2011   TRIG 202.0 (H) 05/04/2023   CHOLHDL 2 05/04/2023   Last hemoglobin A1c Lab Results  Component Value Date   HGBA1C 5.5 05/04/2023   IMPRESSION AND PLAN:  1 hypertension, well-controlled on lisinopril  20 mg a day. Electrolytes and creatinine monitoring today.   2.  Hypercholesterolemia, doing well on atorvastatin  20 mg a day. Lipid panel and hepatic panel today.   3. IFG, last fasting glucose 124 one year ago. A1c about 6 months ago was 5.6%. Fasting glucose today.  4.  External hemorrhoids. Recommended trial of sitz bath.  Also did prescription for Anusol  HC 2.5% cream to use as needed.  An After Visit Summary was printed and given to the patient.  FOLLOW UP: Return in about 6 months (around 05/13/2024) for annual CPE (fasting).  Signed:  Gerlene Hockey, MD           11/11/2023

## 2023-12-10 ENCOUNTER — Ambulatory Visit: Admitting: Family Medicine

## 2023-12-10 ENCOUNTER — Encounter: Payer: Self-pay | Admitting: Family Medicine

## 2023-12-10 VITALS — BP 120/69 | HR 52 | Temp 98.1°F | Ht 73.0 in | Wt 199.8 lb

## 2023-12-10 DIAGNOSIS — Z4802 Encounter for removal of sutures: Secondary | ICD-10-CM | POA: Diagnosis not present

## 2023-12-10 DIAGNOSIS — S61312D Laceration without foreign body of right middle finger with damage to nail, subsequent encounter: Secondary | ICD-10-CM | POA: Diagnosis not present

## 2023-12-10 NOTE — Progress Notes (Signed)
 OFFICE VISIT  12/10/2023  CC:  Chief Complaint  Patient presents with   Stitch Removal    Patient is a 58 y.o. male who presents for stitches removal from the tip of the right middle finger.  HPI: He went to [facility] emergency department on 11/28/2023 for finger laceration that he sustained to the right middle finger while using a table saw. An x-ray showed a comminuted open tuft fracture of the distal phalanx of the third digit.  5 interrupted sutures were placed for wound closure. He was put on Keflex for 7 days. Orthopedics recommended outpatient follow-up in their office.  Followed up with ortho the day after ED, was reassured. He finished the abx.  Past Medical History:  Diagnosis Date   Alcohol abuse 07/02/2010   Depression    Family history of prostate cancer in father    GERD (gastroesophageal reflux disease)    on meds   Heart murmur, systolic    2 D ECHO normal 09/2010   History of adenomatous polyp of colon 04/2018   Recall 3-5 yrs   History of alcohol abuse    1 bottle of wine per day; began cutting back 2012   Hyperlipidemia    on meds   Hypertension    on meds   IFG (impaired fasting glucose)    a1cs have been normal, most recently 03/2020   Seasonal allergies     Past Surgical History:  Procedure Laterality Date   COLONOSCOPY W/ POLYPECTOMY  05/02/2018   2020 VC-MAC-suprep(adeq)-Adenomatous polyps (multiple). 10/2022 +adenomas, recall 5 yrs (Cirigliano)   EYE MUSCLE SURGERY  2005   INGUINAL HERNIA REPAIR Bilateral 1972   TONSILLECTOMY AND ADENOIDECTOMY  1973   TRANSTHORACIC ECHOCARDIOGRAM  10/03/2010   Normal except grd I DD.   WISDOM TOOTH EXTRACTION  1999    Outpatient Medications Prior to Visit  Medication Sig Dispense Refill   atorvastatin  (LIPITOR) 20 MG tablet Take 1 tablet (20 mg total) by mouth daily. 90 tablet 3   Calcium  Carbonate Antacid (TUMS PO) Take 1 tablet by mouth as needed.     hydrocortisone   (ANUSOL -HC) 2.5 % rectal cream Place 1 Application rectally 2 (two) times daily. 30 g 1   lisinopril  (ZESTRIL ) 20 MG tablet Take 1 tablet (20 mg total) by mouth daily. 90 tablet 3   No facility-administered medications prior to visit.    No Known Allergies  Review of Systems  As per HPI  PE:    12/10/2023    1:51 PM 11/11/2023    8:04 AM 10/25/2023   11:13 AM  Vitals with BMI  Height 6' 1 6' 1 6' 1  Weight 199 lbs 13 oz 196 lbs 6 oz 192 lbs 3 oz  BMI 26.37 25.92 25.36  Systolic 120 117 891  Diastolic 69 71 68  Pulse 52 44 95   Physical Exam  Well-appearing. Right middle finger pulp with laceration with sutures intact.  Wound edges well-approximated.  No erythema or exudate. The distal part of the fingernail has a 1 to 2 mm fracture but otherwise the nail is intact. DIP and PIP flexion and extension fully intact. 5 sutures removed today without problem  LABS:  none  IMPRESSION AND PLAN:  Right middle finger laceration, with comminuted open tuft fracture of the distal phalanx of the third digit. Wound closure went well in the ED emergency department. He took antibiotics. He followed up with Ortho and was reassured. Sutures removed today without problem.  PROCEDURE: Consent obtained. Wound cleaned/prepped. No anesthesia. Used suture removal pickups and scissors to remove sutures x 5 (all). Pt tolerated procedure well. No immediate complications. Aftercare instructions discussed.  An After Visit Summary was printed and given to the patient.  FOLLOW UP: Return if symptoms worsen or fail to improve.  Signed:  Gerlene Hockey, MD           12/10/2023

## 2024-04-26 ENCOUNTER — Ambulatory Visit: Admitting: Family Medicine

## 2024-04-26 ENCOUNTER — Ambulatory Visit

## 2024-04-26 ENCOUNTER — Encounter: Payer: Self-pay | Admitting: Family Medicine

## 2024-04-26 VITALS — BP 132/78 | HR 50 | Temp 97.4°F | Ht 73.0 in | Wt 199.2 lb

## 2024-04-26 DIAGNOSIS — W19XXXA Unspecified fall, initial encounter: Secondary | ICD-10-CM

## 2024-04-26 DIAGNOSIS — S43401A Unspecified sprain of right shoulder joint, initial encounter: Secondary | ICD-10-CM

## 2024-04-26 DIAGNOSIS — M25511 Pain in right shoulder: Secondary | ICD-10-CM

## 2024-04-26 NOTE — Progress Notes (Signed)
 OFFICE VISIT  04/26/2024  CC:  Chief Complaint  Patient presents with   Shoulder Pain    Right; 1st fall occurred 1/22 and 2nd was 1/30. No bruising noted. Has taken Ibuprofen    Patient is a 59 y.o. male who presents for right shoulder pain.  HPI: About 2 weeks ago he was trying to straighten out his dog's leash and fell to the right and broke his fall by putting the right arm out.  This jammed his right shoulder.  He then did the same type of injury when he slipped on the ice about a week ago, same shoulder. Hurts in general right shoulder area from [facility] joint region across the anterolateral shoulder, extending back into the right scapular region at times.  No neck pain.  No paresthesias.  Minimal right arm weakness at the shoulder when he has acute pain. Has trouble finding a comfortable position when sleeping. Pain worse with abduction and internal and external rotation. He notes a little bit of swelling in anterior aspect  Past Medical History:  Diagnosis Date   Family history of prostate cancer in father    GERD (gastroesophageal reflux disease)    on meds   Heart murmur, systolic    2 D ECHO normal 09/2010   History of adenomatous polyp of colon 04/2018   Recall 3-5 yrs   History of alcohol abuse    1 bottle of wine per day; began cutting back 2012   History of depression    Hyperlipidemia    on meds   Hypertension    on meds   IFG (impaired fasting glucose)    a1cs have been normal, most recently 03/2020   Seasonal allergies     Past Surgical History:  Procedure Laterality Date   COLONOSCOPY W/ POLYPECTOMY  05/02/2018   2020 VC-MAC-suprep(adeq)-Adenomatous polyps (multiple). 10/2022 +adenomas, recall 5 yrs ([facility])   EYE MUSCLE SURGERY  2005   HERNIA REPAIR  1972   INGUINAL HERNIA REPAIR Bilateral 1972   TONSILLECTOMY AND ADENOIDECTOMY  1973   TRANSTHORACIC ECHOCARDIOGRAM  10/03/2010   Normal except grd I DD.   WISDOM TOOTH EXTRACTION  1999    Outpatient  Medications Prior to Visit  Medication Sig Dispense Refill   atorvastatin  (LIPITOR) 20 MG tablet Take 1 tablet (20 mg total) by mouth daily. 90 tablet 3   Calcium  Carbonate Antacid (TUMS PO) Take 1 tablet by mouth as needed.     hydrocortisone  (ANUSOL -HC) 2.5 % rectal cream Place 1 Application rectally 2 (two) times daily. 30 g 1   lisinopril  (ZESTRIL ) 20 MG tablet Take 1 tablet (20 mg total) by mouth daily. 90 tablet 3   No facility-administered medications prior to visit.    Allergies[1]  Review of Systems  As per HPI  PE:    04/26/2024   10:51 AM 12/10/2023    1:51 PM 11/11/2023    8:04 AM  Vitals with BMI  Height 6' 1 6' 1 6' 1  Weight 199 lbs 3 oz 199 lbs 13 oz 196 lbs 6 oz  BMI 26.29 26.37 25.92  Systolic 132 120 882  Diastolic 78 69 71  Pulse 50 52 44     Physical Exam  General: Alert and well-appearing. Right shoulder with swelling anteriorly over the region of the anterior deltoid/long head of biceps tendon.  Diffusely tender anterolaterally around [facility] joint, long head of biceps tendon, and acromion.  Neer's, speeds, Hawkins, O'Briens, and scarf sign all positive. Empty can positive.  He has a mild amount of strength with supraspinatus testing, possibly only limited due to his pain.  Resisted internal and external rotation do elicit some pain.  There is some prominence of the right AC joint compared to the left. Yergason's negative.  5 out of 5 strength with forearm flexion. 5 out of 5 strength with right forearm supination. Neck range of motion fully intact without pain.  LABS:  Last metabolic panel Lab Results  Component Value Date   GLUCOSE 107 (H) 11/11/2023   NA 138 11/11/2023   K 4.4 11/11/2023   CL 103 11/11/2023   CO2 27 11/11/2023   BUN 19 11/11/2023   CREATININE 0.85 11/11/2023   GFR 96.57 11/11/2023   CALCIUM  9.3 11/11/2023   PROT 6.9 11/11/2023   ALBUMIN 4.7 11/11/2023   BILITOT 0.9 11/11/2023   ALKPHOS 56 11/11/2023   AST 15 11/11/2023    ALT 30 11/11/2023   IMPRESSION AND PLAN:  Acute right shoulder sprain/strain. Suspect internal derangement.  MRI-->rule out a significant rotator cuff tear +/- SLAP lesion. Plain radiograph today to assess for bony injury. He will continue Tylenol every 6 hours as needed pain. Home rehab discussed. Depending on imaging results we may get him into formal physical therapy.  An After Visit Summary was printed and given to the patient.  FOLLOW UP: Return for keep appt set for 05/08/24.  Signed:  Gerlene Hockey, MD           04/26/2024      [1] No Known Allergies

## 2024-04-27 ENCOUNTER — Encounter: Payer: Self-pay | Admitting: Family Medicine

## 2024-04-27 NOTE — Telephone Encounter (Signed)
 No further action needed at this time.

## 2024-04-28 ENCOUNTER — Ambulatory Visit: Payer: Self-pay | Admitting: Family Medicine

## 2024-05-08 ENCOUNTER — Encounter: Admitting: Family Medicine
